# Patient Record
Sex: Female | Born: 1945
Health system: Southern US, Community
[De-identification: ages and names within clinical notes are randomized; demographics above are authoritative.]

## PROBLEM LIST (undated history)

## (undated) DIAGNOSIS — I10 Essential (primary) hypertension: Secondary | ICD-10-CM

## (undated) DIAGNOSIS — C029 Malignant neoplasm of tongue, unspecified: Secondary | ICD-10-CM

## (undated) DIAGNOSIS — G510 Bell's palsy: Secondary | ICD-10-CM

## (undated) HISTORY — PX: JEJUNOSTOMY FEEDING TUBE: SUR737

## (undated) HISTORY — PX: DENTAL SURGERY: SHX609

## (undated) HISTORY — PX: ABDOMINAL HYSTERECTOMY: SHX81

## (undated) HISTORY — DX: Bell's palsy: G51.0

---

## 2000-08-24 ENCOUNTER — Ambulatory Visit (HOSPITAL_COMMUNITY): Admission: RE | Admit: 2000-08-24 | Discharge: 2000-08-24 | Payer: Self-pay | Admitting: Family Medicine

## 2000-08-24 ENCOUNTER — Emergency Department (HOSPITAL_COMMUNITY): Admission: EM | Admit: 2000-08-24 | Discharge: 2000-08-24 | Payer: Self-pay | Admitting: Emergency Medicine

## 2000-08-24 ENCOUNTER — Encounter: Payer: Self-pay | Admitting: Family Medicine

## 2000-08-24 ENCOUNTER — Encounter: Payer: Self-pay | Admitting: Emergency Medicine

## 2003-11-15 ENCOUNTER — Emergency Department (HOSPITAL_COMMUNITY): Admission: EM | Admit: 2003-11-15 | Discharge: 2003-11-15 | Payer: Self-pay | Admitting: Emergency Medicine

## 2004-01-07 ENCOUNTER — Encounter: Admission: RE | Admit: 2004-01-07 | Discharge: 2004-04-06 | Payer: Self-pay | Admitting: Specialist

## 2005-04-26 ENCOUNTER — Inpatient Hospital Stay (HOSPITAL_COMMUNITY): Admission: EM | Admit: 2005-04-26 | Discharge: 2005-05-02 | Payer: Self-pay | Admitting: Emergency Medicine

## 2005-04-28 ENCOUNTER — Ambulatory Visit: Payer: Self-pay | Admitting: Infectious Diseases

## 2005-05-01 ENCOUNTER — Encounter (INDEPENDENT_AMBULATORY_CARE_PROVIDER_SITE_OTHER): Payer: Self-pay | Admitting: *Deleted

## 2005-05-01 ENCOUNTER — Encounter: Payer: Self-pay | Admitting: Interventional Radiology

## 2005-06-02 ENCOUNTER — Ambulatory Visit: Payer: Self-pay | Admitting: Nurse Practitioner

## 2005-06-05 ENCOUNTER — Ambulatory Visit: Payer: Self-pay | Admitting: *Deleted

## 2005-06-12 ENCOUNTER — Ambulatory Visit (HOSPITAL_COMMUNITY): Admission: RE | Admit: 2005-06-12 | Discharge: 2005-06-12 | Payer: Self-pay | Admitting: Family Medicine

## 2005-06-16 ENCOUNTER — Ambulatory Visit: Payer: Self-pay | Admitting: Nurse Practitioner

## 2005-07-05 ENCOUNTER — Ambulatory Visit: Payer: Self-pay | Admitting: Nurse Practitioner

## 2005-09-05 ENCOUNTER — Ambulatory Visit (HOSPITAL_COMMUNITY): Admission: RE | Admit: 2005-09-05 | Discharge: 2005-09-05 | Payer: Self-pay | Admitting: Gastroenterology

## 2005-09-05 ENCOUNTER — Encounter (INDEPENDENT_AMBULATORY_CARE_PROVIDER_SITE_OTHER): Payer: Self-pay | Admitting: Specialist

## 2005-10-17 ENCOUNTER — Ambulatory Visit (HOSPITAL_COMMUNITY): Admission: RE | Admit: 2005-10-17 | Discharge: 2005-10-17 | Payer: Self-pay | Admitting: General Surgery

## 2005-10-27 ENCOUNTER — Ambulatory Visit: Payer: Self-pay | Admitting: Oncology

## 2005-11-01 LAB — CBC WITH DIFFERENTIAL/PLATELET
BASO%: 0.8 % (ref 0.0–2.0)
Basophils Absolute: 0.1 10*3/uL (ref 0.0–0.1)
EOS%: 0.4 % (ref 0.0–7.0)
Eosinophils Absolute: 0 10*3/uL (ref 0.0–0.5)
HGB: 13.4 g/dL (ref 11.6–15.9)
LYMPH%: 45.6 % (ref 14.0–48.0)
MCHC: 34 g/dL (ref 32.0–36.0)
NEUT%: 45.8 % (ref 39.6–76.8)
Platelets: 77 10*3/uL — ABNORMAL LOW (ref 145–400)
WBC: 7 10*3/uL (ref 3.9–10.0)

## 2005-11-02 LAB — COMPREHENSIVE METABOLIC PANEL
ALT: 162 U/L — ABNORMAL HIGH (ref 0–40)
AST: 226 U/L — ABNORMAL HIGH (ref 0–37)
Albumin: 3.5 g/dL (ref 3.5–5.2)
Alkaline Phosphatase: 214 U/L — ABNORMAL HIGH (ref 39–117)
Creatinine, Ser: 0.64 mg/dL (ref 0.40–1.20)
Total Bilirubin: 1.2 mg/dL (ref 0.3–1.2)
Total Protein: 7.6 g/dL (ref 6.0–8.3)

## 2005-11-02 LAB — HEPATITIS B CORE ANTIBODY, TOTAL: Hep B Core Total Ab: NEGATIVE

## 2006-10-30 ENCOUNTER — Ambulatory Visit: Payer: Self-pay | Admitting: Family Medicine

## 2008-06-24 ENCOUNTER — Encounter (INDEPENDENT_AMBULATORY_CARE_PROVIDER_SITE_OTHER): Payer: Self-pay | Admitting: Internal Medicine

## 2008-06-24 ENCOUNTER — Ambulatory Visit: Payer: Self-pay | Admitting: Family Medicine

## 2008-06-24 LAB — CONVERTED CEMR LAB
Basophils Absolute: 0.1 10*3/uL (ref 0.0–0.1)
Basophils Relative: 1 % (ref 0–1)
Eosinophils Relative: 0 % (ref 0–5)
Lymphocytes Relative: 29 % (ref 12–46)
MCV: 100.8 fL — ABNORMAL HIGH (ref 78.0–100.0)
Monocytes Absolute: 0.7 10*3/uL (ref 0.1–1.0)
Neutro Abs: 5.2 10*3/uL (ref 1.7–7.7)
Platelets: 101 10*3/uL — ABNORMAL LOW (ref 150–400)
RBC: 3.9 M/uL (ref 3.87–5.11)
RDW: 17.9 % — ABNORMAL HIGH (ref 11.5–15.5)
WBC: 8.4 10*3/uL (ref 4.0–10.5)

## 2008-07-03 ENCOUNTER — Encounter (INDEPENDENT_AMBULATORY_CARE_PROVIDER_SITE_OTHER): Payer: Self-pay | Admitting: *Deleted

## 2008-07-16 ENCOUNTER — Ambulatory Visit: Payer: Self-pay | Admitting: Internal Medicine

## 2008-07-16 ENCOUNTER — Encounter (INDEPENDENT_AMBULATORY_CARE_PROVIDER_SITE_OTHER): Payer: Self-pay | Admitting: Internal Medicine

## 2008-07-16 LAB — CONVERTED CEMR LAB
ALT: 62 units/L — ABNORMAL HIGH (ref 0–35)
Albumin: 3.8 g/dL (ref 3.5–5.2)
Basophils Relative: 1 % (ref 0–1)
CO2: 20 meq/L (ref 19–32)
Calcium: 8.3 mg/dL — ABNORMAL LOW (ref 8.4–10.5)
Chloride: 108 meq/L (ref 96–112)
Creatinine, Ser: 0.53 mg/dL (ref 0.40–1.20)
Lymphs Abs: 3.2 10*3/uL (ref 0.7–4.0)
MCHC: 30.9 g/dL (ref 30.0–36.0)
Platelets: 116 10*3/uL — ABNORMAL LOW (ref 150–400)
Potassium: 3.8 meq/L (ref 3.5–5.3)
RBC: 4.13 M/uL (ref 3.87–5.11)
RDW: 14.7 % (ref 11.5–15.5)
Sodium: 142 meq/L (ref 135–145)
Total Protein: 8.2 g/dL (ref 6.0–8.3)

## 2008-07-30 ENCOUNTER — Ambulatory Visit (HOSPITAL_COMMUNITY): Admission: RE | Admit: 2008-07-30 | Discharge: 2008-07-30 | Payer: Self-pay | Admitting: Internal Medicine

## 2008-08-13 ENCOUNTER — Ambulatory Visit: Payer: Self-pay | Admitting: Internal Medicine

## 2008-08-27 ENCOUNTER — Ambulatory Visit: Payer: Self-pay | Admitting: Internal Medicine

## 2008-10-15 ENCOUNTER — Ambulatory Visit: Payer: Self-pay | Admitting: Family Medicine

## 2009-05-13 ENCOUNTER — Emergency Department (HOSPITAL_COMMUNITY): Admission: EM | Admit: 2009-05-13 | Discharge: 2009-05-13 | Payer: Self-pay | Admitting: Emergency Medicine

## 2009-06-02 ENCOUNTER — Encounter: Admission: RE | Admit: 2009-06-02 | Discharge: 2009-06-02 | Payer: Self-pay | Admitting: Specialist

## 2009-08-19 ENCOUNTER — Ambulatory Visit: Payer: Self-pay | Admitting: Gastroenterology

## 2009-09-16 ENCOUNTER — Ambulatory Visit: Payer: Self-pay | Admitting: Gastroenterology

## 2009-10-21 ENCOUNTER — Ambulatory Visit: Payer: Self-pay | Admitting: Gastroenterology

## 2010-03-24 ENCOUNTER — Ambulatory Visit: Payer: Self-pay | Admitting: Gastroenterology

## 2010-03-31 ENCOUNTER — Ambulatory Visit: Payer: Self-pay | Admitting: Gastroenterology

## 2010-09-02 NOTE — H&P (Signed)
Elizabeth Lester, Elizabeth Lester               ACCOUNT NO.:  [id number]   MEDICAL RECORD NO.:  [id number]          PATIENT TYPE:  EMS   LOCATION:  ED                           FACILITY:  [facility]   PHYSICIAN:  Sherin Quarry, MD      DATE OF BIRTH:  [birth year]   DATE OF ADMISSION:  04/26/2005  DATE OF DISCHARGE:                                HISTORY & PHYSICAL   HISTORY OF PRESENT ILLNESS:  Elizabeth Lester is a 65 year old lady who  apparently lives with her mother. She is accompanied today by her children  who state that they seldom see her and really do not know very much about  her health history or status. According to the daughters, the patient's  sister had gone out to the store to get some items for their mother.  She  returned to the house about 12:00 p.m. and found Mrs. Coote lying on the  floor of the living room, apparently unconscious.  She had urinated and her  pants were wet.  They called 9-1-1, and apparently before the ambulance  arrived Mrs. Camilli was revived.  On reviving, Mrs. Hashimi indicated that  she had no headache, shortness of breath, chest pain, nausea, vomiting or  abdominal pain. The family feels that Mrs. Hoeg has had no previous  syncopal episodes or previous history of stroke or cardiac disease. Mrs.  Sheffler smokes one-half pack of cigarettes per day. She says that she will  drink any beer, wine or liquor that she can get a hold of, and that probably  she ran out of all alcohol about a day and half ago. Since then, she has  been feeling somewhat tremulous.  Neither of her daughters are willing to  estimate how much alcohol she normally consumes.  She states that she does  not use any drugs.  On further questioning, she indicates that recently she  has noticed some red blood in the stool and has also had intermittent  stabbing pains which she localizes to the periumbilical area.   On arrival to the emergency room, her blood pressure was 165/98, pulse was  115.  There were numerous abnormalities found on her laboratory studies  including a hemoglobin of 10, MCV of 102.8, platelet count of 60,000,  potassium of 3, glucose of 140, albumin of 2.5, SGOT of 204, SGPT of 108,  alkaline phosphatase of 160.  Total bilirubin was 1.2. Urine drug screen was  negative.   In light of the syncopal episode and also the patient's numerous metabolic  abnormalities, as well as her apparent incipient alcohol withdrawal  symptoms, we will admit her to the hospital for further evaluation.   PAST MEDICAL HISTORY:   MEDICATIONS:  None.   ALLERGIES:  None.   OPERATIONS:  The only operation she has had the past was tubal ligation.  According to the patient and also to her daughters, she has never been in  the hospital.   FAMILY HISTORY:  She apparently has 15 siblings.  One of her brothers died  of the brain tumor.  No one is aware of any  of her other siblings having any  health problems. Her mother has hypertension.   SOCIAL HISTORY:  She smokes about one-half to one pack of cigarettes per  day. She drinks a substantial amount of alcohol; the exact amount is  unclear. I gather that she does not get out very much and has fairly limited  exercise tolerance   REVIEW OF SYSTEMS:  HEAD:  She denies headache or dizziness. EYES:  She  denies visual blurring or clear.  EARS/NOSE/THROAT:  Denies earache, sinus  pain or sore throat. CHEST:  Denies coughing wheezing or chest congestion.  CARDIOVASCULAR:  Denies orthopnea, PND or ankle edema. GI: See above. GU:  Denies dysuria or urinary frequency. NEUROLOGIC:  There is no history of  seizure or stroke.  ENDOCRINE:  Denies excessive thirst, urinary frequency  or nocturia.   PHYSICAL EXAMINATION:  GENERAL:  She is a chronically ill-appearing lady who  is edentulous and really has a difficult time giving much useful  information.  HEENT: Exam is within normal limits. There is no meningismus.  CHEST:  Clear.  BACK:   Examination the back reveals no CVA or point tenderness.  SKIN:  Her skin is quite dry.  CARDIOVASCULAR:  Reveals normal S1-S2. There are no rubs, murmurs or  gallops.  ABDOMEN:  Benign. There are normal bowel sounds without masses or  tenderness. There is no guarding or rebound.  NEUROLOGIC TESTING:  Within normal limits.  EXTREMITIES:  Very dry skin.  There is no cyanosis or edema.   IMPRESSION:  1.  Chronic alcohol abuse with apparent incipient alcohol withdrawal.  2.  Recent syncopal episode of unclear etiology.  3.  Possible history of gastrointestinal bleeding.  4.  Anemia with megaloblastic indices.  5.  Thrombocytopenia (these may both be secondary to alcohol abuse).  6.  Hypokalemia.  7.  Abnormal liver functions, possibly secondary to alcohol abuse.  8.  History of untreated hypertension.   PLAN:  The patient will be placed on alcohol withdrawal protocol. Will  obtain a CT scan of the head.  I think a CT scan of the abdomen is also  indicated, given her diffuse liver abnormalities and complaints of  epigastric and periumbilical pain.  Will follow her hemoglobin and  hematocrit, heme stools, check ferritin, B12 and folate.  Will replace  potassium and give her intravenous fluids.  Will treat her elevated blood  pressure with a beta blocker and also give empiric Protonix. I think that  lifestyle issues are the key thing with this patient, but hopefully we can  help in some fashion with this, as well.           ______________________________  Sherin Quarry, MD     SY/MEDQ  D:  04/26/2005  T:  04/26/2005  Job:  [id number]   cc:   [facility]

## 2010-09-02 NOTE — Consult Note (Signed)
Elizabeth Lester, KLIMAS               ACCOUNT NO.:  [id number]   MEDICAL RECORD NO.:  [id number]          PATIENT TYPE:  INP   LOCATION:  1430                         FACILITY:  [facility]   PHYSICIAN:  Cristi Loron, M.D.DATE OF BIRTH:  1945/07/19   DATE OF CONSULTATION:  DATE OF DISCHARGE:  04/27/2005                                   CONSULTATION   CHIEF COMPLAINT:  Fell down.   HISTORY OF PRESENT ILLNESS:  The patient is a 65 year old black female, who  was by report found unconscious yesterday afternoon approximately noon.  The  patient was brought to [facility] via private vehicle and was  admitted by Dr. Sherin Quarry .  The patient underwent further workup  including a brain CT, which demonstrated a right frontal hyper density,  which was followed up by a brain MRI with and without contrast, which  demonstrated a somewhat vascular small right frontal lesion.  The plan is to  work this up further with a cerebral arteriogram, and Dr. Ladona Ridgel has kindly  requested a Neurosurgeon consultation.   Presently, the patient is without complaint.  She denies headaches, nausea,  vomiting, seizures, numbness, tingling, weakness, chest pain, abdominal  pain, etcetera.   PAST MEDICAL HISTORY:  Significant for alcoholism and it is possible she was  withdrawing from alcohol when she had this event.  She denies a prior  history of alcohol withdrawal seizures.   Past medical history is positive for hypertension, alcoholism, a  questionable history of GI bleed, anemia, hypokalemia, abnormal liver  function test.  Thrombocytopenia.   PAST SURGICAL HISTORY:  Hysterectomy.   MEDICATIONS PRIOR TO ADMISSION:  None.   ALLERGIES:  NO KNOWN DRUG ALLERGIES.   FAMILY MEDICAL HISTORY:  The patient's parents are both alive in their 65s  and apparently healthy for their age.   SOCIAL HISTORY:  The patient is single.  She has five children.  She lives  [place].  She smokes one half pack  per day of cigarettes x40 years.  I  advised her to quit.  She by report is an alcoholic, and I advised her to  seek help with quitting this as well.  She denies illicit drug use,  intravenous drug abuse, etcetera.   REVIEW OF SYSTEMS:  Negative except as above.   PHYSICAL EXAMINATION:  GENERAL:  A pleasant tremulous 65 year old black  female in no apparent distress.  HEENT:  Normocephalic, atraumatic.  Pupils equal, round and reactive to  light.  Extraocular muscles are intact.  Oropharynx benign.  NECK:  Supple without deformities, tracheal deviation.  SPINE:  She has limited cervical range of motion, thorac symmetric.  HEART:  Regular rate and rhythm.  ABDOMEN:  Soft, nontender.  Protuberant.  EXTREMITIES:  No obvious deformities.  NEUROLOGIC EXAMINATION:  The patient is alert and oriented x3.  Glasgow Coma  Scale 15.  Cranial nerves 2 through 12 were examined bilaterally and grossly  normal.  The patient hearing grossly normal bilaterally.  Motor strength is  5/5 in her bilateral hand grip, biceps and triceps, deltoid, the psoas,  quadriceps, gastrocnemius, extensor  brevis longus.  Sensory exam is intact  to light.  Sensation altered.  Tested dermatomes bilaterally.  Deep tendon  reflexes are 1/4 in her bilateral biceps, triceps, quadriceps, and  gastrocnemius.  There is no ankle clonus.  Sensory reflexes intact in light  touch and all tests of dermatomes bilateral.  Cerebellar function is intact  to rapid auditory movement in the upper extremities bilateral.   IMAGING STUDIES:  I reviewed the patient's brain/head CT performed April 26, 2005 at [facility].  Without contrast, it demonstrates a small  right frontal hyper density without significant mass effect.   I also reviewed the patient's brain MRI performed with and without contrast  at [facility] April 27, 2005 that demonstrates again a small  right frontal vascular lesion without mass effect.    ASSESSMENT AND PLAN:  Right frontal lesion.  I have discussed the issues  with the patient, her daughter, and son-in-law at the patient's request .  I  told there was several possibilities including a vascular tumor, a vascular  anomaly such as a venous angioma, cavernous angioma, arteriovenous  malformation, myochotic aneurysm, etcetera.  I agree with the transfer to  [facility] and further workup with a cerebral arteriogram to help  [place] distinguish between these lesions.  Depending on what it is, she may or  may not need surgery.   Alcohol abuse, obviously she needs to be treated for DTs medically.      Cristi Loron, M.D.  Electronically Signed     JDJ/MEDQ  D:  04/27/2005  T:  04/28/2005  Job:  [id number]

## 2010-09-02 NOTE — Op Note (Signed)
NAMESERAI, Elizabeth Lester               ACCOUNT NO.:  [id number]   MEDICAL RECORD NO.:  [id number]          PATIENT TYPE:  AMB   LOCATION:  ENDO                         FACILITY:  [facility]   PHYSICIAN:  Petra Kuba, M.D.    DATE OF BIRTH:  10/14/1945   DATE OF PROCEDURE:  09/05/2005  DATE OF DISCHARGE:                                 OPERATIVE REPORT   PROCEDURE:  Colonoscopy with biopsy.   INDICATIONS:  Patient with rectal prolapse, bright red blood per rectum, due  for colonic screening.  Wanted preop evaluation.  Consent was signed after  risks, benefits and options thoroughly discussed with me prior to sedation  and in the office with my nurse.   MEDICINES USED:  Fentanyl 80 mcg, Versed 8 mg.   PROCEDURE:  Rectal inspection was pertinent for obvious prolapse.  Digital  exam is negative.  Pediatric video adjustable colonoscope was inserted and  easily advanced around the colon to the cecum.  Other than a small proximal  ascending lipoma, no abnormalities or signs of bleeding were seen on  insertion.  Cecum was identified by the appendiceal orifice and the  ileocecal valve.  The scope inserted a short way into the terminal ileum  which was normal.  Photo documentation was obtained.  The scope was slowly  withdrawn.  Prep was adequate.  There was some liquid stool that required  suctioning only on slow withdrawal through the colon.  Other than the  lipoma, the cecum, ascending, and transverse were normal.  In the splenic  flexure, a tiny polyp was seen and was cold biopsied x 2.  On slow  withdrawal through the left side of the colon, a few other tiny hyperplastic-  appearing polyps were seen and were cold biopsied times one or two and put  in the same container.  No other abnormalities were seen as we slowly  withdrew back to the rectum.  Anal rectal evaluation on retroflexion  confirmed both the hemorrhoids and probably some small hemorrhoids.  The  scope was then readvanced a short  way up the left side of the colon.  Air  was suctioned.  Scope removed.  The patient tolerated the procedure well.  There was no obvious immediate complication.   ENDOSCOPIC DIAGNOSIS:  1.  Obvious prolapse and small hemorrhoids.  2.  Multiple tiny left-sided polyps, probably hyperplastic except for maybe      the one in the splenic flexure.  Status post cold biopsy.  3.  Ascending proximal lipoma, small, not biopsied.  4.  Otherwise within normal limits to the terminal ileum.   PLAN:  Await pathology to determine future colonic screening.  Okay from my  standpoint for surgical options.  Happy to see back p.r.n.           ______________________________  Petra Kuba, M.D.     MEM/MEDQ  D:  09/05/2005  T:  09/05/2005  Job:  [id number]

## 2010-09-02 NOTE — Discharge Summary (Signed)
NAME:  Elizabeth Lester, Elizabeth Lester               ACCOUNT NO.:  [id number]   MEDICAL RECORD NO.:  [id number]          PATIENT TYPE:  INP   LOCATION:  3016                         FACILITY:  [facility]   PHYSICIAN:  Melissa L. Ladona Ridgel, MD  DATE OF BIRTH:  Feb 06, 1946   DATE OF ADMISSION:  04/28/2005  DATE OF DISCHARGE:  05/02/2005                                 DISCHARGE SUMMARY   CHIEF COMPLAINT:  Syncope.   DISCHARGE DIAGNOSES:  1.  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  She was to follow up as an outpatient for further      evaluation of the treatment options.  The patient had no further      syncopal episodes during the course of the hospital stay.  She was      detoxed from alcohol, with good results.  2.  Alcohol abuse.  The patient underwent detoxification using Ativan      protocol and low-dose Haldol.  At the time of discharge, the patient was      instructed to follow up as an outpatient for further rehabilitation and      supportive therapies.  3.  Hemoccult-positive stool.  The patient has significant hemorrhoids which      appeared to be the source of her bleeding; however, she should obtain an      outpatient esophagogastroduodenoscopy/colonoscopy to assure that there      is no other source of her bleeding from the gastrointestinal tract.  4.  Cardiovascular.  The patient had a syncopal workup which consisted of an      echocardiogram which showed no obvious cardiomyopathy.  Ejection      fraction was within normal limits.  5.  Low-grade temperature.  The patient showed negative blood cultures and      negative urinalysis.  She was followed empirically, and no further      workup was deemed necessary to determine whether or not she had      endocarditis, especially since the angiogram showed an arteriovenous      malformation and not a mycotic aneurysm which was  originally thought to      be the case.   DISCHARGE MEDICATIONS:  1.  Ativan 1 mg p.o. daily.  2.  Multivitamin.   FOLLOW UP:  The patient was requested to follow up at the [facility].   HISTORY OF PRESENT ILLNESS:  The patient is a 65 year old African American  female with a questionable history of GI bleeding in the past who was found  lying in a puddle of her own urine in her living room.  She was brought to  the emergency room for further evaluation where she was noted to have a  negative urine drug screen but was found to have a hemoglobin of 10 with a  macrocytic anemia, a potassium level of 3, elevated liver enzymes, and known  ethanol abuse history.  The patient was admitted to a telemetry floor  and  evaluated by CT scan.  CT of the head showed a abnormality in the frontal  lobe which required further evaluation with MRI.  The patient underwent MRI  which showed an enhancing lesion in the right frontal lobe, questionable for  a mycotic aneurysm.  She was transferred to [facility] and admitted  to the telemetry floor for further evaluation.  A neurosurgery consult was  obtained, and it was planned that she would undergo an angiogram when she  was clinically stable from a detox perspective.  The patient was placed on  an Ativan protocol and detoxified appropriately.  She underwent arteriogram  which showed an AV malformation.  It was decided that the patient would  follow up as an outpatient with Dr. Lovell Sheehan.  This was discussed with the  patient's family who were going to assist her in her followup needs.   On the day of discharge, the patient was seen and evaluated by Dr. Jasmine Pang and found to be stable from a vital sign standpoint.  Cardiovascular  was regular rate and rhythm.  Respiratory was clear to auscultation.  The  abdomen was soft, nontender, nondistended.  Hemoglobin remained stable at  10.  The patient continued to have Hemoccult-positive  stools but no decrease  in her hemoglobin or hematocrit.  The source of the bleed was likely  secondary to hemorrhoidal bleeding which was treated empirically with  softeners and topical agents.  The patient was recommended to have a GI  workup as an outpatient.   The patient was deemed stable for discharge to the care of her daughter who  was to help her followup as an outpatient.   DISPOSITION:  Stable.   FOLLOW UP:  The patient was to follow up with Dr. Lovell Sheehan and [facility].      Melissa L. Ladona Ridgel, MD  Electronically Signed     MLT/MEDQ  D:  06/23/2005  T:  06/26/2005  Job:  [id number]

## 2010-10-26 LAB — HM COLONOSCOPY

## 2010-10-26 LAB — HM MAMMOGRAPHY

## 2011-01-09 DIAGNOSIS — I10 Essential (primary) hypertension: Secondary | ICD-10-CM | POA: Insufficient documentation

## 2012-03-20 DIAGNOSIS — C760 Malignant neoplasm of head, face and neck: Secondary | ICD-10-CM | POA: Insufficient documentation

## 2012-07-22 ENCOUNTER — Encounter (HOSPITAL_COMMUNITY): Payer: Self-pay | Admitting: Emergency Medicine

## 2012-07-22 ENCOUNTER — Emergency Department (HOSPITAL_COMMUNITY)
Admission: EM | Admit: 2012-07-22 | Discharge: 2012-07-22 | Disposition: A | Discharge status: Home / Self Care | Payer: Medicaid Other | Attending: Emergency Medicine | Admitting: Emergency Medicine

## 2012-07-22 ENCOUNTER — Emergency Department (HOSPITAL_COMMUNITY): Payer: Medicaid Other

## 2012-07-22 DIAGNOSIS — H6120 Impacted cerumen, unspecified ear: Secondary | ICD-10-CM | POA: Insufficient documentation

## 2012-07-22 DIAGNOSIS — R05 Cough: Secondary | ICD-10-CM | POA: Insufficient documentation

## 2012-07-22 DIAGNOSIS — F17200 Nicotine dependence, unspecified, uncomplicated: Secondary | ICD-10-CM | POA: Insufficient documentation

## 2012-07-22 DIAGNOSIS — I10 Essential (primary) hypertension: Secondary | ICD-10-CM | POA: Insufficient documentation

## 2012-07-22 DIAGNOSIS — J069 Acute upper respiratory infection, unspecified: Secondary | ICD-10-CM | POA: Insufficient documentation

## 2012-07-22 DIAGNOSIS — Z85810 Personal history of malignant neoplasm of tongue: Secondary | ICD-10-CM | POA: Insufficient documentation

## 2012-07-22 DIAGNOSIS — R059 Cough, unspecified: Secondary | ICD-10-CM | POA: Insufficient documentation

## 2012-07-22 HISTORY — DX: Essential (primary) hypertension: I10

## 2012-07-22 HISTORY — DX: Malignant neoplasm of tongue, unspecified: C02.9

## 2012-07-22 LAB — COMPREHENSIVE METABOLIC PANEL
ALT: 39 U/L — ABNORMAL HIGH (ref 0–35)
AST: 34 U/L (ref 0–37)
Albumin: 3.5 g/dL (ref 3.5–5.2)
Alkaline Phosphatase: 112 U/L (ref 39–117)
BUN: 10 mg/dL (ref 6–23)
CO2: 34 mEq/L — ABNORMAL HIGH (ref 19–32)
Calcium: 9.4 mg/dL (ref 8.4–10.5)
Chloride: 97 mEq/L (ref 96–112)
Creatinine, Ser: 0.57 mg/dL (ref 0.50–1.10)
GFR calc Af Amer: >90 mL/min (ref >90)
GFR calc non Af Amer: >90 mL/min (ref >90)
Glucose, Bld: 102 mg/dL — ABNORMAL HIGH (ref 70–99)
Potassium: 4.2 mEq/L (ref 3.5–5.1)
Sodium: 138 mEq/L (ref 135–145)
Total Bilirubin: 0.3 mg/dL (ref 0.3–1.2)
Total Protein: 7.5 g/dL (ref 6.0–8.3)

## 2012-07-22 LAB — CBC WITH DIFFERENTIAL/PLATELET
Basophils Absolute: 0 10*3/uL (ref 0.0–0.1)
Basophils Relative: 1 % (ref 0–1)
Eosinophils Absolute: 0.1 10*3/uL (ref 0.0–0.7)
Eosinophils Relative: 2 % (ref 0–5)
HCT: 41.2 % (ref 36.0–46.0)
Hemoglobin: 13.6 g/dL (ref 12.0–15.0)
Lymphocytes Relative: 22 % (ref 12–46)
Lymphs Abs: 1.8 10*3/uL (ref 0.7–4.0)
MCH: 32.1 pg (ref 26.0–34.0)
MCHC: 33 g/dL (ref 30.0–36.0)
MCV: 97.2 fL (ref 78.0–100.0)
Monocytes Absolute: 0.8 10*3/uL (ref 0.1–1.0)
Monocytes Relative: 10 % (ref 3–12)
Neutro Abs: 5.5 10*3/uL (ref 1.7–7.7)
Neutrophils Relative %: 66 % (ref 43–77)
Platelets: 172 10*3/uL (ref 150–400)
RBC: 4.24 MIL/uL (ref 3.87–5.11)
RDW: 13.2 % (ref 11.5–15.5)
WBC: 8.2 10*3/uL (ref 4.0–10.5)

## 2012-07-22 MED ORDER — GUAIFENESIN 100 MG/5ML PO LIQD: 100.0000 mg | ORAL | Status: DC | PRN

## 2012-07-22 NOTE — ED Provider Notes (Signed)
History     CSN: [id number]  Arrival date & time 07/22/12  1303   First MD Initiated Contact with Patient 07/22/12 1431      Chief Complaint  Patient presents with  . Dizziness    (Consider location/radiation/quality/duration/timing/severity/associated sxs/prior treatment) HPI Comments: Patient is a 67 year old woman who presents today with dizziness for 1 week. She has had a productive cough for 1 week. She is bringing up "clear phelgm". The cough is worse at night. It has remained unchanged. She is lightheaded without syncope. She states she has been drinking a normal amount of fluid. No loss of appetite, fever, abdominal pain, nausea, vomiting, diarrhea. History of tongue cancer.   The history is provided by the patient. No language interpreter was used.    Past Medical History  Diagnosis Date  . Hypertension   . Cancer of tongue     Past Surgical History  Procedure Laterality Date  . Abdominal hysterectomy      History reviewed. No pertinent family history.  History  Substance Use Topics  . Smoking status: Current Every Day Smoker    Types: Cigarettes  . Smokeless tobacco: Not on file  . Alcohol Use: Yes    OB History   Grav Para Term Preterm Abortions TAB SAB Ect Mult Living                  Review of Systems  Constitutional: Negative for fever, chills and appetite change.  Respiratory: Negative for shortness of breath.   Cardiovascular: Negative for chest pain.  Gastrointestinal: Negative for nausea, vomiting, abdominal pain, diarrhea and abdominal distention.  Genitourinary: Negative for dysuria.  All other systems reviewed and are negative.    Allergies  Review of patient's allergies indicates no known allergies.  Home Medications   Current Outpatient Rx  Name  Route  Sig  Dispense  Refill  . lisinopril (PRINIVIL,ZESTRIL) 20 MG tablet   Oral   Take 20 mg by mouth daily.           BP 155/94  Pulse 77  Temp(Src) 99.4 F (37.4 C) (Oral)   Resp 20  SpO2 98%  Physical Exam  Nursing note and vitals reviewed. Constitutional: She is oriented to person, place, and time. She appears well-developed and well-nourished. No distress.  HENT:  Head: Normocephalic and atraumatic.  Right Ear: Hearing, tympanic membrane and external ear normal.  Left Ear: Hearing, tympanic membrane and external ear normal.  Nose: Nose normal.  Mouth/Throat: Uvula is midline and oropharynx is clear and moist. Mucous membranes are dry. Abnormal dentition.  Cerumen present in both canals Missing dentition  Eyes: Conjunctivae are normal.  Neck: Trachea normal, normal range of motion and phonation normal. No tracheal tenderness present.  Submandibular area hard - likely scar tissue from radiation therapy   Cardiovascular: Normal rate, regular rhythm and normal heart sounds.   Pulmonary/Chest: Effort normal and breath sounds normal. No stridor. No respiratory distress. She has no wheezes. She has no rales.  Abdominal: Soft. She exhibits no distension. There is no tenderness.  Musculoskeletal: Normal range of motion.  Neurological: She is alert and oriented to person, place, and time. She has normal strength.  Skin: Skin is warm and dry. She is not diaphoretic. No erythema.  Psychiatric: She has a normal mood and affect. Her behavior is normal.    ED Course  Procedures (including critical care time)  Labs Reviewed  COMPREHENSIVE METABOLIC PANEL - Abnormal; Notable for the following:  CO2 34 (*)    Glucose, Bld 102 (*)    ALT 39 (*)    All other components within normal limits  CBC WITH DIFFERENTIAL   Dg Chest 2 View  07/22/2012  *RADIOLOGY REPORT*  Clinical Data: Dizziness for several days.  No chest complaints. Hypertension.  Smoker.  CHEST - 2 VIEW  Comparison: 04/26/2005  Findings: Probable gallstones anteriorly on the lateral view.  Midline trachea.  Normal heart size.  Tortuous thoracic aorta. No pleural effusion or pneumothorax.  Diffuse  peribronchial thickening.  Clear lungs.  IMPRESSION: 1.  No acute cardiopulmonary disease. 2.  Mild peribronchial thickening which may relate to chronic bronchitis or smoking. 3.  Cholelithiasis.   Original Report Authenticated By: Jeronimo Greaves, M.D.     Date: 07/22/2012  Rate: 74  Rhythm: normal sinus rhythm  QRS Axis: normal  Intervals: normal  ST/T Wave abnormalities: normal  Conduction Disutrbances:none  Narrative Interpretation:   Old EKG Reviewed: unchanged   1. Upper respiratory virus       MDM  Patient presents for lightheadedness without syncope for 1 week. EKG, orthostatic vital signs WNL. She has had a cough likely due to post nasal drip for 2 weeks. Lungs CTA, oxygen sats stayed 98% on RA through course of ED stay. CXR showed no pneumonia. History of CA on tongue, treated with radiation. Follows with oncologist yearly with negative CTs. Encouraged to keep drinking PO fluids. OTC cold meds. Robitussin given for cough. Resource guide given. She expressed interest in establishing care with new pcp because it is difficult to make appointments with her current PCP. Return precautions given. Vital signs stable for discharge. Dr. Juleen China saw this patient and agrees with plan. Patient / Family / Caregiver informed of clinical course, understand medical decision-making process, and agree with plan.       Mora Bellman, PA-C 07/22/12 1810

## 2012-07-22 NOTE — ED Notes (Signed)
Due to lack of equipment for thorough exam, PA requested pt be moved to room 12 which was done.

## 2012-07-22 NOTE — ED Notes (Signed)
[id number]<AV> Expected date:<BR> Expected time:<BR> Means of arrival:<BR> Comments:<BR> Hold for 27

## 2012-07-22 NOTE — ED Notes (Signed)
PA at bedside.

## 2012-07-22 NOTE — ED Notes (Addendum)
Patient reports dizziness x 1 week.  Patient is taking BP meds.  Patient denies syncope.  Patient also reports having a cold x 2 weeks and rec'd a flu shot last Tuesday.

## 2012-07-27 NOTE — ED Provider Notes (Signed)
Medical screening examination/treatment/procedure(s) were performed by non-physician practitioner and as supervising physician I was immediately available for consultation/collaboration.  Raeford Razor, MD 07/27/12 [phone]

## 2012-10-04 DIAGNOSIS — D696 Thrombocytopenia, unspecified: Secondary | ICD-10-CM | POA: Diagnosis not present

## 2012-10-04 DIAGNOSIS — R5383 Other fatigue: Secondary | ICD-10-CM | POA: Diagnosis not present

## 2012-10-04 DIAGNOSIS — N926 Irregular menstruation, unspecified: Secondary | ICD-10-CM | POA: Diagnosis not present

## 2012-10-04 DIAGNOSIS — R5381 Other malaise: Secondary | ICD-10-CM | POA: Diagnosis not present

## 2012-10-04 DIAGNOSIS — G4700 Insomnia, unspecified: Secondary | ICD-10-CM | POA: Diagnosis not present

## 2012-10-04 DIAGNOSIS — I10 Essential (primary) hypertension: Secondary | ICD-10-CM | POA: Diagnosis not present

## 2012-10-04 DIAGNOSIS — Z01419 Encounter for gynecological examination (general) (routine) without abnormal findings: Secondary | ICD-10-CM | POA: Diagnosis not present

## 2013-02-17 DIAGNOSIS — E7800 Pure hypercholesterolemia, unspecified: Secondary | ICD-10-CM | POA: Diagnosis not present

## 2013-02-17 DIAGNOSIS — R05 Cough: Secondary | ICD-10-CM | POA: Diagnosis not present

## 2013-02-17 DIAGNOSIS — J019 Acute sinusitis, unspecified: Secondary | ICD-10-CM | POA: Diagnosis not present

## 2013-02-17 DIAGNOSIS — R059 Cough, unspecified: Secondary | ICD-10-CM | POA: Diagnosis not present

## 2013-02-17 DIAGNOSIS — H60399 Other infective otitis externa, unspecified ear: Secondary | ICD-10-CM | POA: Diagnosis not present

## 2013-02-17 DIAGNOSIS — H6120 Impacted cerumen, unspecified ear: Secondary | ICD-10-CM | POA: Diagnosis not present

## 2013-02-17 DIAGNOSIS — I10 Essential (primary) hypertension: Secondary | ICD-10-CM | POA: Diagnosis not present

## 2013-03-21 DIAGNOSIS — H60399 Other infective otitis externa, unspecified ear: Secondary | ICD-10-CM | POA: Diagnosis not present

## 2013-03-21 DIAGNOSIS — I10 Essential (primary) hypertension: Secondary | ICD-10-CM | POA: Diagnosis not present

## 2013-03-21 DIAGNOSIS — E7800 Pure hypercholesterolemia, unspecified: Secondary | ICD-10-CM | POA: Diagnosis not present

## 2013-03-21 DIAGNOSIS — R05 Cough: Secondary | ICD-10-CM | POA: Diagnosis not present

## 2013-03-21 DIAGNOSIS — R059 Cough, unspecified: Secondary | ICD-10-CM | POA: Diagnosis not present

## 2013-03-21 DIAGNOSIS — J019 Acute sinusitis, unspecified: Secondary | ICD-10-CM | POA: Diagnosis not present

## 2013-03-24 DIAGNOSIS — Z1231 Encounter for screening mammogram for malignant neoplasm of breast: Secondary | ICD-10-CM | POA: Diagnosis not present

## 2013-03-24 LAB — HM MAMMOGRAPHY

## 2013-03-26 ENCOUNTER — Encounter (HOSPITAL_COMMUNITY): Payer: Self-pay | Admitting: Emergency Medicine

## 2013-03-26 ENCOUNTER — Emergency Department (HOSPITAL_COMMUNITY)
Admission: EM | Admit: 2013-03-26 | Discharge: 2013-03-26 | Disposition: A | Discharge status: Home / Self Care | Payer: Medicare Other | Attending: Emergency Medicine | Admitting: Emergency Medicine

## 2013-03-26 DIAGNOSIS — R42 Dizziness and giddiness: Secondary | ICD-10-CM | POA: Diagnosis present

## 2013-03-26 DIAGNOSIS — H9209 Otalgia, unspecified ear: Secondary | ICD-10-CM | POA: Insufficient documentation

## 2013-03-26 DIAGNOSIS — F17200 Nicotine dependence, unspecified, uncomplicated: Secondary | ICD-10-CM | POA: Insufficient documentation

## 2013-03-26 DIAGNOSIS — Z85810 Personal history of malignant neoplasm of tongue: Secondary | ICD-10-CM | POA: Insufficient documentation

## 2013-03-26 DIAGNOSIS — Z79899 Other long term (current) drug therapy: Secondary | ICD-10-CM | POA: Insufficient documentation

## 2013-03-26 DIAGNOSIS — I10 Essential (primary) hypertension: Secondary | ICD-10-CM | POA: Insufficient documentation

## 2013-03-26 DIAGNOSIS — Z760 Encounter for issue of repeat prescription: Secondary | ICD-10-CM

## 2013-03-26 LAB — CBC
HCT: 38.6 % (ref 36.0–46.0)
Hemoglobin: 12.2 g/dL (ref 12.0–15.0)
MCHC: 31.6 g/dL (ref 30.0–36.0)
WBC: 5.9 10*3/uL (ref 4.0–10.5)

## 2013-03-26 LAB — BASIC METABOLIC PANEL
BUN: 14 mg/dL (ref 6–23)
Chloride: 99 mEq/L (ref 96–112)
Glucose, Bld: 101 mg/dL — ABNORMAL HIGH (ref 70–99)
Potassium: 4.6 mEq/L (ref 3.5–5.1)

## 2013-03-26 MED ORDER — LISINOPRIL 20 MG PO TABS: 20.0000 mg | ORAL_TABLET | Freq: Every day | ORAL | Status: DC

## 2013-03-26 NOTE — ED Notes (Signed)
Bed: WA04 Expected date:  Expected time:  Means of arrival:  Comments: 

## 2013-03-26 NOTE — ED Provider Notes (Signed)
CSN: [id number]     Arrival date & time 03/26/13  4098 History   First MD Initiated Contact with Patient 03/26/13 1116     Chief Complaint  Patient presents with  . Hypertension  . Otalgia  . Dizziness   (Consider location/radiation/quality/duration/timing/severity/associated sxs/prior Treatment) Patient is a 67 y.o. female presenting with ear pain and neurologic complaint. The history is provided by the patient.  Otalgia Location:  Left Behind ear:  No abnormality Quality:  Aching Severity:  Mild Onset quality:  Gradual Duration:  3 weeks Timing:  Intermittent Progression:  Partially resolved Chronicity:  New Associated symptoms: no abdominal pain, no congestion, no cough, no diarrhea, no fever, no headaches, no neck pain and no vomiting   Neurologic Problem This is a new problem. Episode onset: 4 days ago. The problem occurs rarely. The problem has not changed since onset.Pertinent negatives include no chest pain, no abdominal pain, no headaches and no shortness of breath. Nothing aggravates the symptoms. Nothing relieves the symptoms. She has tried nothing for the symptoms. The treatment provided no relief.    Past Medical History  Diagnosis Date  . Hypertension   . Cancer of tongue    Past Surgical History  Procedure Laterality Date  . Abdominal hysterectomy     History reviewed. No pertinent family history. History  Substance Use Topics  . Smoking status: Current Every Day Smoker -- 0.25 packs/day    Types: Cigarettes  . Smokeless tobacco: Never Used  . Alcohol Use: Yes     Comment: occ   OB History   Grav Para Term Preterm Abortions TAB SAB Ect Mult Living                 Review of Systems  Constitutional: Negative for fever and fatigue.  HENT: Positive for ear pain. Negative for congestion and drooling.   Eyes: Negative for pain.  Respiratory: Negative for cough and shortness of breath.   Cardiovascular: Negative for chest pain.  Gastrointestinal:  Negative for nausea, vomiting, abdominal pain and diarrhea.  Genitourinary: Negative for dysuria and hematuria.  Musculoskeletal: Negative for back pain, gait problem and neck pain.  Skin: Negative for color change.  Neurological: Positive for dizziness (mild, intermittent). Negative for headaches.  Hematological: Negative for adenopathy.  Psychiatric/Behavioral: Negative for behavioral problems.  All other systems reviewed and are negative.    Allergies  Review of patient's allergies indicates no known allergies.  Home Medications   Current Outpatient Rx  Name  Route  Sig  Dispense  Refill  . lisinopril (PRINIVIL,ZESTRIL) 20 MG tablet   Oral   Take 20 mg by mouth daily.         . Multiple Vitamin (MULTIVITAMIN WITH MINERALS) TABS tablet   Oral   Take 1 tablet by mouth daily.         . NEOMYCIN-POLYMYXIN-HYDROCORTISONE (CORTISPORIN) 1 % SOLN otic solution   Left Ear   Place 4 drops into the left ear 4 (four) times daily. For 7 days. Patient's last day was Sat. 03/22/13         . lisinopril (PRINIVIL,ZESTRIL) 20 MG tablet   Oral   Take 1 tablet (20 mg total) by mouth daily.   30 tablet   0    BP 155/91  Pulse 93  Temp(Src) 98.4 F (36.9 C) (Oral)  Resp 20  SpO2 100% Physical Exam  Nursing note and vitals reviewed. Constitutional: She is oriented to person, place, and time. She appears well-developed and well-nourished.  HENT:  Head: Normocephalic.  Mouth/Throat: Oropharynx is clear and moist. No oropharyngeal exudate.  Eyes: Conjunctivae and EOM are normal. Pupils are equal, round, and reactive to light.  Neck: Normal range of motion. Neck supple.  Cardiovascular: Normal rate, regular rhythm, normal heart sounds and intact distal pulses.  Exam reveals no gallop and no friction rub.   No murmur heard. Pulmonary/Chest: Effort normal and breath sounds normal. No respiratory distress. She has no wheezes.  Abdominal: Soft. Bowel sounds are normal. There is no  tenderness. There is no rebound and no guarding.  Musculoskeletal: Normal range of motion. She exhibits no edema and no tenderness.  Neurological: She is alert and oriented to person, place, and time. She has normal strength. No cranial nerve deficit or sensory deficit. She displays a negative Romberg sign. Coordination and gait normal.  Normal finger to nose bilaterally.  The patient is able to walk forwards and backwards without any difficulty. She can also walk heel to toe without any difficulty. Romberg negative.  Skin: Skin is warm and dry.  Psychiatric: She has a normal mood and affect. Her behavior is normal.    ED Course  Procedures (including critical care time) Labs Review Labs Reviewed  CBC - Abnormal; Notable for the following:    Platelets 128 (*)    All other components within normal limits  BASIC METABOLIC PANEL - Abnormal; Notable for the following:    Glucose, Bld 101 (*)    All other components within normal limits  POCT I-STAT TROPONIN I   Imaging Review No results found.  EKG Interpretation   None       MDM   1. Medication refill   2. Dizziness    11:45 AM 67 y.o. female with a history of hypertension who presents with intermittent dizziness for 4 days, left intermittent otalgia for several weeks, and request for medication refill. She states that she has been out of her lisinopril for 4 days. She notes intermittent dizziness sensation occasionally when standing since then. She states that she has been well otherwise and denies any fevers, vomiting, diarrhea. She is afebrile and vital signs are unremarkable here. She has a normal neurologic exam. She states that she gets mild intermittent headaches but does not currently have a headache. Will refill her blood pressure medicine and recommend she followup with her primary care provider. I do not believe the testing is necessary at this time.  11:49 AM:  I have discussed the diagnosis/risks/treatment options  with the patient and believe the pt to be eligible for discharge home to follow-up with pcp as needed. We also discussed returning to the ED immediately if new or worsening sx occur. We discussed the sx which are most concerning (e.g., worsening dizziness, cp, sob) that necessitate immediate return. Any new prescriptions provided to the patient are listed below.  New Prescriptions   LISINOPRIL (PRINIVIL,ZESTRIL) 20 MG TABLET    Take 1 tablet (20 mg total) by mouth daily.       Junius Argyle, MD 03/26/13 1150

## 2013-03-26 NOTE — ED Notes (Signed)
Pt c/o hypertension and dizziness x 1 week and L ear pain x "several days."  Pt sts "I went to my doctor to get my BP medication refilled and they haven't called it in yet.  The office isn't open today.  Last time my ear hurt like this, I had wax build up and they had to clean it out."

## 2013-03-26 NOTE — ED Notes (Signed)
Pt complains she has ran out of bp meds since Saturday.  Pt complains of dizziness and left ear pain.

## 2013-06-25 ENCOUNTER — Telehealth: Payer: Self-pay

## 2013-06-25 NOTE — Telephone Encounter (Signed)
Medication and allergies:  Reviewed and updated  90 day supply/mail order: n/a Local pharmacy:  [id number] - [place], [address]   Immunizations due: Influenza-DUE, PNA-DUE, Zoster-DUE   A/P: No changes to personal, family history or past surgical hx PAP- 07/05/05-negative with Trichomonas present CCS- "not recently"- unsure of last one and no record of it in chart MMG- patient believes she received one this year, but could not remember date Flu- DUE Tdap- patient states she's had one within the last 10 years PNA- DUE Shingles- DUE  Patient plans to bring some of her previous records with her during visit.    To Discuss with Provider: Intermittent left ear pain. Denies drainage or hearing loss. Vaginal odor.

## 2013-06-27 ENCOUNTER — Other Ambulatory Visit (HOSPITAL_COMMUNITY)
Admission: RE | Admit: 2013-06-27 | Discharge: 2013-06-27 | Disposition: A | Discharge status: Home / Self Care | Payer: Medicare Other | Source: Ambulatory Visit | Attending: Family Medicine | Admitting: Family Medicine

## 2013-06-27 ENCOUNTER — Ambulatory Visit (INDEPENDENT_AMBULATORY_CARE_PROVIDER_SITE_OTHER): Payer: Medicare Other | Admitting: Family Medicine

## 2013-06-27 ENCOUNTER — Encounter: Payer: Self-pay | Admitting: Family Medicine

## 2013-06-27 VITALS — BP 136/76 | HR 72 | Temp 98.4°F | Ht 66.0 in | Wt 127.0 lb

## 2013-06-27 DIAGNOSIS — N760 Acute vaginitis: Secondary | ICD-10-CM | POA: Diagnosis not present

## 2013-06-27 DIAGNOSIS — Z23 Encounter for immunization: Secondary | ICD-10-CM | POA: Diagnosis not present

## 2013-06-27 DIAGNOSIS — E2839 Other primary ovarian failure: Secondary | ICD-10-CM | POA: Diagnosis not present

## 2013-06-27 DIAGNOSIS — Z124 Encounter for screening for malignant neoplasm of cervix: Secondary | ICD-10-CM

## 2013-06-27 DIAGNOSIS — Z113 Encounter for screening for infections with a predominantly sexual mode of transmission: Secondary | ICD-10-CM | POA: Insufficient documentation

## 2013-06-27 DIAGNOSIS — Z0000 Encounter for general adult medical examination without abnormal findings: Secondary | ICD-10-CM | POA: Diagnosis not present

## 2013-06-27 DIAGNOSIS — I10 Essential (primary) hypertension: Secondary | ICD-10-CM | POA: Diagnosis not present

## 2013-06-27 DIAGNOSIS — N898 Other specified noninflammatory disorders of vagina: Secondary | ICD-10-CM | POA: Diagnosis not present

## 2013-06-27 DIAGNOSIS — Z1151 Encounter for screening for human papillomavirus (HPV): Secondary | ICD-10-CM | POA: Diagnosis not present

## 2013-06-27 LAB — CBC WITH DIFFERENTIAL/PLATELET
BASOS ABS: 0 10*3/uL (ref 0.0–0.1)
BASOS PCT: 0.5 % (ref 0.0–3.0)
EOS ABS: 0.2 10*3/uL (ref 0.0–0.7)
Eosinophils Relative: 1.9 % (ref 0.0–5.0)
HCT: 38.4 % (ref 36.0–46.0)
Hemoglobin: 12.8 g/dL (ref 12.0–15.0)
LYMPHS PCT: 19 % (ref 12.0–46.0)
Lymphs Abs: 1.6 10*3/uL (ref 0.7–4.0)
MCHC: 33.2 g/dL (ref 30.0–36.0)
MCV: 99.2 fl (ref 78.0–100.0)
MONO ABS: 0.7 10*3/uL (ref 0.1–1.0)
Monocytes Relative: 8 % (ref 3.0–12.0)
NEUTROS PCT: 70.6 % (ref 43.0–77.0)
Neutro Abs: 5.9 10*3/uL (ref 1.4–7.7)
PLATELETS: 131 10*3/uL — AB (ref 150.0–400.0)
RBC: 3.88 Mil/uL (ref 3.87–5.11)
RDW: 14.8 % — AB (ref 11.5–14.6)
WBC: 8.4 10*3/uL (ref 4.5–10.5)

## 2013-06-27 LAB — LIPID PANEL
CHOL/HDL RATIO: 2
Cholesterol: 169 mg/dL (ref 0–200)
HDL: 70.8 mg/dL (ref >39.00)
LDL Cholesterol: 78 mg/dL (ref 0–99)
TRIGLYCERIDES: 102 mg/dL (ref 0.0–149.0)
VLDL: 20.4 mg/dL (ref 0.0–40.0)

## 2013-06-27 LAB — HEPATIC FUNCTION PANEL
ALBUMIN: 4.3 g/dL (ref 3.5–5.2)
ALK PHOS: 66 U/L (ref 39–117)
ALT: 53 U/L — ABNORMAL HIGH (ref 0–35)
AST: 39 U/L — ABNORMAL HIGH (ref 0–37)
BILIRUBIN DIRECT: 0 mg/dL (ref 0.0–0.3)
TOTAL PROTEIN: 7.4 g/dL (ref 6.0–8.3)
Total Bilirubin: 0.6 mg/dL (ref 0.3–1.2)

## 2013-06-27 LAB — POCT URINALYSIS DIPSTICK
BILIRUBIN UA: NEGATIVE
Glucose, UA: NEGATIVE
KETONES UA: NEGATIVE
Leukocytes, UA: NEGATIVE
Nitrite, UA: NEGATIVE
PH UA: 6
PROTEIN UA: NEGATIVE
RBC UA: NEGATIVE
Spec Grav, UA: 1.01
Urobilinogen, UA: 0.2

## 2013-06-27 LAB — BASIC METABOLIC PANEL
BUN: 15 mg/dL (ref 6–23)
CALCIUM: 9.6 mg/dL (ref 8.4–10.5)
CHLORIDE: 103 meq/L (ref 96–112)
CO2: 29 meq/L (ref 19–32)
CREATININE: 0.5 mg/dL (ref 0.4–1.2)
GFR: 148.5 mL/min (ref >60.00)
GLUCOSE: 98 mg/dL (ref 70–99)
Potassium: 3.9 mEq/L (ref 3.5–5.1)
Sodium: 139 mEq/L (ref 135–145)

## 2013-06-27 NOTE — Progress Notes (Signed)
Subjective:    Elizabeth Lester is a 68 y.o. female who presents for a welcome to Medicare exam.   Cardiac risk factors: advanced age (older than 73 for men, 72 for women), hypertension and sedentary lifestyle.  Activities of Daily Living  In your present state of health, do you have any difficulty performing the following activities?:  Preparing food and eating?: No Bathing yourself: No Getting dressed: No Using the toilet:No Moving around from place to place: No In the past year have you fallen or had a near fall?:No  Current exercise habits: The patient does not participate in regular exercise at present.   Dietary issues discussed: na   Depression Screen (Note: if answer to either of the following is "Yes", then a more complete depression screening is indicated)  Q1: Over the past two weeks, have you felt down, depressed or hopeless?no Q2: Over the past two weeks, have you felt little interest or pleasure in doing things? no   The following portions of the patient's history were reviewed and updated as appropriate: allergies, current medications, past family history, past medical history, past social history, past surgical history and problem list. Review of Systems  Review of Systems  Constitutional: Negative for activity change, appetite change and fatigue.  HENT: Negative for hearing loss, congestion, tinnitus and ear discharge.   Eyes: Negative for visual disturbance (see optho q1y -- vision corrected to 20/20 with glasses).  Respiratory: Negative for cough, chest tightness and shortness of breath.   Cardiovascular: Negative for chest pain, palpitations and leg swelling.  Gastrointestinal: Negative for abdominal pain, diarrhea, constipation and abdominal distention.  Genitourinary: Negative for urgency, frequency, decreased urine volume and difficulty urinating.  Musculoskeletal: Negative for back pain, arthralgias and gait problem.  Skin: Negative for color change, pallor  and rash.  Neurological: Negative for dizziness, light-headedness, numbness and headaches.  Hematological: Negative for adenopathy. Does not bruise/bleed easily.  Psychiatric/Behavioral: Negative for suicidal ideas, confusion, sleep disturbance, self-injury, dysphoric mood, decreased concentration and agitation.  Pt is able to read and write and can do all ADLs No risk for falling No abuse/ violence in home      Objective:     Vision by Snellen chart: opth Blood pressure 136/76, pulse 72, temperature 98.4 F (36.9 C), temperature source Oral, height 5\' 6"  (1.676 m), weight 127 lb (57.607 kg), SpO2 99.00%. Body mass index is 20.51 kg/(m^2). BP 136/76  Pulse 72  Temp(Src) 98.4 F (36.9 C) (Oral)  Ht 5\' 6"  (1.676 m)  Wt 127 lb (57.607 kg)  BMI 20.51 kg/m2  SpO2 99% General appearance: alert, cooperative, appears stated age and no distress Head: Normocephalic, without obvious abnormality, atraumatic Eyes: conjunctivae/corneas clear. PERRL, EOM's intact. Fundi benign. Ears: normal TM's and external ear canals both ears Nose: Nares normal. Septum midline. Mucosa normal. No drainage or sinus tenderness. Throat: abnormal findings: s/p hx ca tongue Neck: no adenopathy, supple, symmetrical, trachea midline and thyroid not enlarged, symmetric, no tenderness/mass/nodules Back: symmetric, no curvature. ROM normal. No CVA tenderness. Lungs: clear to auscultation bilaterally Breasts: normal appearance, no masses or tenderness Heart: regular rate and rhythm, S1, S2 normal, no murmur, click, rub or gallop Abdomen: soft, non-tender; bowel sounds normal; no masses,  no organomegaly Pelvic: normal ext genitalia, + d/c, pap and cultures done Extremities: extremities normal, atraumatic, no cyanosis or edema Pulses: 2+ and symmetric Skin: Skin color, texture, turgor normal. No rashes or lesions Lymph nodes: Cervical, supraclavicular, and axillary nodes normal. Neurologic: Alert and oriented X 3,  normal strength and tone. Normal symmetric reflexes. Normal coordination and gait Psych- no depression, no anxiety      Assessment:     cpe      Plan:     During the course of the visit the patient was educated and counseled about appropriate screening and preventive services including:   Pneumococcal vaccine   Influenza vaccine  Screening electrocardiogram  Screening mammography  Bone densitometry screening  Colorectal cancer screening  Diabetes screening  Glaucoma screening  Advanced directives: has an advanced directive - a copy HAS NOT been provided.  Patient Instructions (the written plan) was given to the patient.   1. Welcome to Medicare preventive visit  Check labs See avs - EKG 12-Lead  2. Need for prophylactic vaccination and inoculation against influenza  - Flu Vaccine QUAD 36+ mos PF IM (Fluarix)  3. Need for pneumococcal vaccination  - Pneumococcal polysaccharide vaccine 23-valent greater than or equal to 2yo subcutaneous/IM  4. Estrogen deficiency  - DG Bone Density; Future  5. HTN (hypertension) Stable, con't meds - Basic metabolic panel - CBC with Differential - Hepatic function panel - Lipid panel - POCT urinalysis dipstick  6. Screening for malignant neoplasm of the cervix  - Cytology - PAP  7. Vaginal discharge Check pap and cultures - Cytology - PAP

## 2013-06-27 NOTE — Progress Notes (Signed)
Pre visit review using our clinic review tool, if applicable. No additional management support is needed unless otherwise documented below in the visit note. 

## 2013-06-27 NOTE — Patient Instructions (Signed)

## 2013-06-30 ENCOUNTER — Other Ambulatory Visit: Payer: Self-pay

## 2013-06-30 ENCOUNTER — Telehealth: Payer: Self-pay | Admitting: Family Medicine

## 2013-06-30 DIAGNOSIS — R740 Nonspecific elevation of levels of transaminase and lactic acid dehydrogenase [LDH]: Principal | ICD-10-CM

## 2013-06-30 DIAGNOSIS — R7402 Elevation of levels of lactic acid dehydrogenase (LDH): Secondary | ICD-10-CM

## 2013-06-30 DIAGNOSIS — D7289 Other specified disorders of white blood cells: Secondary | ICD-10-CM

## 2013-06-30 NOTE — Telephone Encounter (Signed)
Relevant patient education mailed to patient.  

## 2013-06-30 NOTE — Telephone Encounter (Signed)
Patient called and stating that she was seen by dr Etter Sjogren on Friday and she was suppose to have some ear drop medication called into her pharmacy. Please advise.

## 2013-06-30 NOTE — Telephone Encounter (Signed)
Patient has been made aware and agreed to recommendations       KP

## 2013-06-30 NOTE — Telephone Encounter (Signed)
No Rx--- just use debrox otc for wax and call for irrigation if no improvement in 1 week

## 2013-06-30 NOTE — Telephone Encounter (Signed)
No documentation of drops. Please advise      KP

## 2013-07-01 LAB — CERVICOVAGINAL ANCILLARY ONLY
Bacterial vaginitis: NEGATIVE
Candida vaginitis: NEGATIVE

## 2013-07-07 DIAGNOSIS — Z780 Asymptomatic menopausal state: Secondary | ICD-10-CM | POA: Diagnosis not present

## 2013-07-07 LAB — HM DEXA SCAN

## 2013-07-14 ENCOUNTER — Other Ambulatory Visit (INDEPENDENT_AMBULATORY_CARE_PROVIDER_SITE_OTHER): Payer: Medicare Other

## 2013-07-14 DIAGNOSIS — D7289 Other specified disorders of white blood cells: Secondary | ICD-10-CM

## 2013-07-14 DIAGNOSIS — R7401 Elevation of levels of liver transaminase levels: Secondary | ICD-10-CM | POA: Diagnosis not present

## 2013-07-14 DIAGNOSIS — R7402 Elevation of levels of lactic acid dehydrogenase (LDH): Secondary | ICD-10-CM | POA: Diagnosis not present

## 2013-07-14 DIAGNOSIS — R740 Nonspecific elevation of levels of transaminase and lactic acid dehydrogenase [LDH]: Principal | ICD-10-CM

## 2013-07-14 LAB — GAMMA GT: GGT: 56 U/L — ABNORMAL HIGH (ref 7–51)

## 2013-07-14 LAB — HEPATIC FUNCTION PANEL
ALT: 40 U/L — AB (ref 0–35)
AST: 29 U/L (ref 0–37)
Albumin: 4.3 g/dL (ref 3.5–5.2)
Alkaline Phosphatase: 64 U/L (ref 39–117)
BILIRUBIN DIRECT: 0 mg/dL (ref 0.0–0.3)
BILIRUBIN TOTAL: 0.3 mg/dL (ref 0.3–1.2)
TOTAL PROTEIN: 7.5 g/dL (ref 6.0–8.3)

## 2013-07-15 LAB — CBC WITH DIFFERENTIAL/PLATELET
Basophils Absolute: 0 10*3/uL (ref 0.0–0.1)
Basophils Relative: 0.3 % (ref 0.0–3.0)
Eosinophils Absolute: 0.2 10*3/uL (ref 0.0–0.7)
Eosinophils Relative: 2.6 % (ref 0.0–5.0)
HCT: 39.3 % (ref 36.0–46.0)
Hemoglobin: 13 g/dL (ref 12.0–15.0)
Lymphocytes Relative: 24.7 % (ref 12.0–46.0)
Lymphs Abs: 2 10*3/uL (ref 0.7–4.0)
MCHC: 33 g/dL (ref 30.0–36.0)
MCV: 98.4 fl (ref 78.0–100.0)
Monocytes Absolute: 0.5 10*3/uL (ref 0.1–1.0)
Monocytes Relative: 6.6 % (ref 3.0–12.0)
Neutro Abs: 5.4 10*3/uL (ref 1.4–7.7)
Neutrophils Relative %: 65.8 % (ref 43.0–77.0)
Platelets: 130 10*3/uL — ABNORMAL LOW (ref 150.0–400.0)
RBC: 3.99 Mil/uL (ref 3.87–5.11)
RDW: 14.1 % (ref 11.5–14.6)
WBC: 8.2 10*3/uL (ref 4.5–10.5)

## 2013-09-29 ENCOUNTER — Ambulatory Visit (INDEPENDENT_AMBULATORY_CARE_PROVIDER_SITE_OTHER): Payer: Medicare Other | Admitting: Family Medicine

## 2013-09-29 ENCOUNTER — Encounter: Payer: Self-pay | Admitting: Family Medicine

## 2013-09-29 VITALS — BP 136/82 | HR 71 | Temp 98.8°F | Wt 129.4 lb

## 2013-09-29 DIAGNOSIS — H6120 Impacted cerumen, unspecified ear: Secondary | ICD-10-CM | POA: Diagnosis not present

## 2013-09-29 DIAGNOSIS — R7989 Other specified abnormal findings of blood chemistry: Secondary | ICD-10-CM | POA: Diagnosis not present

## 2013-09-29 DIAGNOSIS — H60399 Other infective otitis externa, unspecified ear: Secondary | ICD-10-CM

## 2013-09-29 DIAGNOSIS — H6092 Unspecified otitis externa, left ear: Secondary | ICD-10-CM

## 2013-09-29 DIAGNOSIS — R945 Abnormal results of liver function studies: Principal | ICD-10-CM

## 2013-09-29 MED ORDER — OFLOXACIN 0.3 % OT SOLN: 10.0000 [drp] | Freq: Every day | OTIC | Status: DC

## 2013-09-29 NOTE — Patient Instructions (Signed)
Liver Panel A liver panel, also known as liver (hepatic) function tests or LFT, is used to detect liver damage or disease. One or more of these tests are ordered when symptoms suspicious of a liver condition are noticed. These include: jaundice, dark urine, or light-colored bowel movements; nausea, vomiting and/or diarrhea; loss of appetite; vomiting of blood; bloody or black bowel movements; swelling or pain in the belly; unusual weight change; or fatigue or loss of stamina. One or more of these tests may also be ordered when a person has been or may have been exposed to a hepatitis virus; has a family history of liver disease; has excessive alcohol intake; or is taking a drug that can cause liver damage. A liver panel usually includes 7 tests that are run at the same time on a blood sample. These include:  Alanine aminotransferase (ALT)  an enzyme mainly found in the liver; the best test for detecting hepatitis.  NORMAL FINDINGS  Elderly: may be slightly higher than adult values  Adult/child: 4-36 international units/L at 37 C or4-36 units/L (SI units)  Values may be higher in men and in African Americans.  Infant: may be twice as high as adult values. Alkaline phosphatase (ALP)  an enzyme related to the bile ducts; often increased when they are blocked NORMAL FINDINGS  Elderly: slightly higher than an adult.  Adult: 30-120 units/L or 0.5-2.0 microKat/L (SI units)  Child:/adolescent:  Less than 2 years: 85-235 units/L  2-8 Years: 65-210 units/L  9-15 years: 60-300 units/L  16-21 years: 30-200 units/L Aspartate aminotransferase (AST)  an enzyme found in the liver and a few other places, particularly the heart and other muscles in the body. NORMAL FINDINGS  Age / Normal value (units)  0-5 days / 35-140  Less than 3 yr / 15-60  3-6 yr / 15-50  6-12 yr / 10-50  12-18 yr / 10-40  Adult / 0-35 units/L or 0-0.58 microKat/L (SI units) (Females tend to have slightly lower than  males)  Elderly / Slightly higher than adults. Bilirubin  two different tests of bilirubin often used together (especially if a person has jaundice): total bilirubin measures all the bilirubin in the blood; direct bilirubin measures a form made in the liver.   Adult/elderly/child  Total bilirubin: 0.3-1.0 mg/dL or 5.1-17 micromole/L (SI units)  Indirect bilirubin: 0.2-0.8 mg/dL or 3.4-12.0 micromole/L (SI units)  Direct bilirubin: 0.1-0.3 mg/dL or 1.7-5.1 micromole/L (SI units)  Newborn total bilirubin:1.0-12.0 mg/dL or17.1-205 micromole/L (SI units)  Urine: 0.0-0.2 mg/dL Albumin  measures the main protein made by the liver and tells how well the liver is making this protein  Total Protein - measures albumin and all other proteins in blood, including antibodies made to help fight off infections.  NORMAL FINDINGS  Adult/Elderly  Total protein: 6.4-8.3 g/dL or 64-83 g/L (SI units)  Albumin: 3.5-5 g/dL or 35-50 g/L (SI units)  Globulin: 2.3-3.4 g/dL  Alpha1 globulin: 0.1-0.3 g/dL or 1-3 g/L (SI units)  Alpha2 globulin: 0.6-1 g/dL or 6-10 g/L (SI units)  Beta globulin: 0.7-1.1 g/dL or 7-11 g/L (SI units) Children  Total protein  Premature infant: 4.2-7.6 g/dL  Newborn: 4.6-7.4 g/dL  Infant: 6-6.7 g/dL  Child: 6.2-8 g/dL  Albumin  Premature infant: 3-4.2 g/dL  Newborn: 3.5-5.4 g/dL  Infant: 4.4-5.4 g/dL  Child: 4-5.9 g/dL Ranges for normal findings may vary among different laboratories and hospitals. You should always check with your doctor after having lab work or other tests done to discuss the meaning of your   test results and whether your values are considered within normal limits. MEANING OF TEST  Your caregiver will go over the test results with you and discuss the importance and meaning of your results, as well as treatment options and the need for additional tests if necessary. OBTAINING THE TEST RESULTS It is your responsibility to obtain your test results.  Ask the lab or department performing the test when and how you will get your results. Document Released: 04/28/2004 Document Revised: 06/26/2011 Document Reviewed: 03/16/2008 [employer] Patient Information 2014 [employer].  

## 2013-09-29 NOTE — Progress Notes (Signed)
  Subjective:    Patient here for follow-up of elevated blood pressure.  She is not exercising and is adherent to a low-salt diet.  Blood pressure is well controlled at home. Cardiac symptoms: none. Patient denies: chest pain, chest pressure/discomfort, claudication, dyspnea, exertional chest pressure/discomfort, fatigue, irregular heart beat, lower extremity edema, near-syncope, orthopnea, palpitations, paroxysmal nocturnal dyspnea, syncope and tachypnea. Cardiovascular risk factors: advanced age (older than 62 for men, 26 for women) and hypertension. Use of agents associated with hypertension: none. History of target organ damage: none. Pt also c/o L ear feeling clogged and odor from ear.  No other complaints.  The following portions of the patient's history were reviewed and updated as appropriate: allergies, current medications, past family history, past medical history, past social history, past surgical history and problem list.  Review of Systems Pertinent items are noted in HPI.     Objective:    BP 136/82  Pulse 71  Temp(Src) 98.8 F (37.1 C) (Oral)  Wt 129 lb 6.4 oz (58.695 kg)  SpO2 97% General appearance: alert, cooperative, appears stated age and no distress Ears: abnormal TM left ear - + wax and pus with swelling in canal Neck: no adenopathy, no carotid bruit, no JVD, supple, symmetrical, trachea midline and thyroid not enlarged, symmetric, no tenderness/mass/nodules Lungs: clear to auscultation bilaterally Heart: S1, S2 normal Extremities: extremities normal, atraumatic, no cyanosis or edema    Assessment:    Hypertension, normal blood pressure . Evidence of target organ damage: none.    Plan:    Medication: no change. Dietary sodium restriction. Check blood pressures 2-3 times weekly and record. Follow up: 2 weeks and as needed. ---to recheck ear  1. Elevated liver function tests   - Hepatic function panel  2. Left otitis externa   - ofloxacin (FLOXIN) 0.3  % otic solution; Place 10 drops into the left ear daily.  Dispense: 5 mL; Refill: 0  3. Cerumen impaction  Recheck 1-2 weeks for irrigation prn - ofloxacin (FLOXIN) 0.3 % otic solution; Place 10 drops into the left ear daily.  Dispense: 5 mL; Refill: 0

## 2013-09-29 NOTE — Progress Notes (Signed)
Pre visit review using our clinic review tool, if applicable. No additional management support is needed unless otherwise documented below in the visit note. 

## 2013-09-30 LAB — HEPATIC FUNCTION PANEL
ALT: 30 U/L (ref 0–35)
AST: 27 U/L (ref 0–37)
Albumin: 4.1 g/dL (ref 3.5–5.2)
Alkaline Phosphatase: 61 U/L (ref 39–117)
BILIRUBIN TOTAL: 0.4 mg/dL (ref 0.2–1.2)
Bilirubin, Direct: 0.1 mg/dL (ref 0.0–0.3)
TOTAL PROTEIN: 7.2 g/dL (ref 6.0–8.3)

## 2014-01-05 ENCOUNTER — Ambulatory Visit (INDEPENDENT_AMBULATORY_CARE_PROVIDER_SITE_OTHER): Payer: Medicare Other | Admitting: Family Medicine

## 2014-01-05 ENCOUNTER — Encounter: Payer: Self-pay | Admitting: Family Medicine

## 2014-01-05 VITALS — BP 147/82 | HR 67 | Temp 98.2°F | Wt 130.1 lb

## 2014-01-05 DIAGNOSIS — H60399 Other infective otitis externa, unspecified ear: Secondary | ICD-10-CM | POA: Diagnosis not present

## 2014-01-05 DIAGNOSIS — H60392 Other infective otitis externa, left ear: Secondary | ICD-10-CM

## 2014-01-05 MED ORDER — OFLOXACIN 0.3 % OT SOLN: 5.0000 [drp] | Freq: Every day | OTIC | Status: DC

## 2014-01-05 MED ORDER — CEFUROXIME AXETIL 500 MG PO TABS: 500.0000 mg | ORAL_TABLET | Freq: Two times a day (BID) | ORAL | Status: AC

## 2014-01-05 NOTE — Progress Notes (Signed)
Pre visit review using our clinic review tool, if applicable. No additional management support is needed unless otherwise documented below in the visit note. 

## 2014-01-05 NOTE — Progress Notes (Signed)
   Subjective:    Patient ID: Elizabeth Lester, female    DOB: April 28, 1945, 68 y.o.   MRN: [id number]  HPI Pt here c/o R ear feeling plugged and painful.   No other symptoms   Review of Systems    as abve Objective:   Physical Exam  BP 147/82  Pulse 67  Temp(Src) 98.2 F (36.8 C) (Oral)  Wt 130 lb 1.1 oz (59 kg)  SpO2 100% Ears:  r ear--+ errythema and pus -- after wax was removed with irrigation.  Unable to use hoop.        Assessment & Plan:  1. Otitis, externa, infective, left Take meds as rx,  rto prn - ofloxacin (FLOXIN) 0.3 % otic solution; Place 5 drops into the left ear daily.  Dispense: 5 mL; Refill: 0 - cefUROXime (CEFTIN) 500 MG tablet; Take 1 tablet (500 mg total) by mouth 2 (two) times daily.  Dispense: 20 tablet; Refill: 0

## 2014-01-05 NOTE — Patient Instructions (Signed)

## 2014-05-05 ENCOUNTER — Ambulatory Visit (INDEPENDENT_AMBULATORY_CARE_PROVIDER_SITE_OTHER): Payer: Medicare Other | Admitting: Family Medicine

## 2014-05-05 ENCOUNTER — Encounter: Payer: Self-pay | Admitting: Family Medicine

## 2014-05-05 VITALS — BP 160/92 | HR 75 | Temp 98.3°F | Wt 129.0 lb

## 2014-05-05 DIAGNOSIS — I10 Essential (primary) hypertension: Secondary | ICD-10-CM

## 2014-05-05 DIAGNOSIS — J0110 Acute frontal sinusitis, unspecified: Secondary | ICD-10-CM

## 2014-05-05 DIAGNOSIS — H9202 Otalgia, left ear: Secondary | ICD-10-CM

## 2014-05-05 MED ORDER — ACETIC ACID 2 % OT SOLN: 4.0000 [drp] | Freq: Four times a day (QID) | OTIC | Status: DC

## 2014-05-05 MED ORDER — CEFUROXIME AXETIL 500 MG PO TABS: 500.0000 mg | ORAL_TABLET | Freq: Two times a day (BID) | ORAL | Status: AC

## 2014-05-05 MED ORDER — FLUTICASONE PROPIONATE 50 MCG/ACT NA SUSP: 2.0000 | Freq: Every day | NASAL | Status: DC

## 2014-05-05 NOTE — Progress Notes (Signed)
  Subjective:     Elizabeth Lester is a 69 y.o. female who presents for evaluation of sinus pain. Symptoms include: congestion, cough, facial pain, foul rhinorrhea, headaches, nasal congestion, purulent rhinorrhea, sinus pressure and con't L ear pain-- daughter is with her and she states the ear has been bothering her for a year or more.. Onset of symptoms was 2 weeks ago. Symptoms have been gradually worsening since that time. Past history is significant for no history of pneumonia or bronchitis. Patient is a smoker.  The following portions of the patient's history were reviewed and updated as appropriate: allergies, current medications, past family history, past medical history, past social history, past surgical history and problem list.  Review of Systems Pertinent items are noted in HPI.   Objective:    BP 160/92 mmHg  Pulse 75  Temp(Src) 98.3 F (36.8 C) (Oral)  Wt 129 lb (58.514 kg)  SpO2 98% General appearance: alert, cooperative, appears stated age and no distress Ears: L canal flaky and red Nose: green discharge, moderate congestion, turbinates red, swollen, sinus tenderness bilateral Throat: abnormal findings: mild oropharyngeal erythema and lymphadenopathy Neck: mild anterior cervical adenopathy, supple, symmetrical, trachea midline and thyroid not enlarged, symmetric, no tenderness/mass/nodules Lungs: clear to auscultation bilaterally    Assessment:    Acute bacterial sinusitis.    Plan:    Nasal steroids per medication orders. Antihistamines per medication orders. Ceftin per medication orders.    1. Otalgia, left Unrelated to #2-- been a problem for over a year - Ambulatory referral to ENT - acetic acid (VOSOL) 2 % otic solution; Place 4 drops into the left ear 4 (four) times daily.  Dispense: 15 mL; Refill: 0  2. Acute frontal sinusitis, recurrence not specified  - cefUROXime (CEFTIN) 500 MG tablet; Take 1 tablet (500 mg total) by mouth 2 (two) times daily.   Dispense: 20 tablet; Refill: 0 - fluticasone (FLONASE) 50 MCG/ACT nasal spray; Place 2 sprays into both nostrils daily.  Dispense: 16 g; Refill: 6

## 2014-05-05 NOTE — Patient Instructions (Signed)

## 2014-05-05 NOTE — Assessment & Plan Note (Signed)
Stop nyquil severe cold and rto 2-3 weeks or sooner prn

## 2014-05-05 NOTE — Progress Notes (Signed)
Pre visit review using our clinic review tool, if applicable. No additional management support is needed unless otherwise documented below in the visit note. 

## 2014-05-21 ENCOUNTER — Ambulatory Visit: Payer: Medicare Other | Admitting: Family Medicine

## 2014-05-26 ENCOUNTER — Ambulatory Visit (INDEPENDENT_AMBULATORY_CARE_PROVIDER_SITE_OTHER): Payer: Medicare Other | Admitting: Family Medicine

## 2014-05-26 ENCOUNTER — Encounter: Payer: Self-pay | Admitting: Family Medicine

## 2014-05-26 VITALS — BP 138/90 | HR 78 | Temp 98.3°F | Wt 128.0 lb

## 2014-05-26 DIAGNOSIS — H6090 Unspecified otitis externa, unspecified ear: Secondary | ICD-10-CM | POA: Diagnosis not present

## 2014-05-26 DIAGNOSIS — J0110 Acute frontal sinusitis, unspecified: Secondary | ICD-10-CM | POA: Diagnosis not present

## 2014-05-26 DIAGNOSIS — Z23 Encounter for immunization: Secondary | ICD-10-CM

## 2014-05-26 NOTE — Progress Notes (Signed)
Pre visit review using our clinic review tool, if applicable. No additional management support is needed unless otherwise documented below in the visit note. 

## 2014-05-26 NOTE — Progress Notes (Signed)
   Subjective:    Patient ID: Elizabeth Lester, female    DOB: November 23, 1945, 69 y.o.   MRN: [id number]  HPI  Patient here for f/u R ear pain and odor.  She states the pain is gone but she still has odor.   She is also c/o r side neck pain when she leans forward and turns head--symptoms < 92months but greater then 2 months. No other symptoms. Pain only last a few seconds.    Past Medical History  Diagnosis Date  . Hypertension   . Cancer of tongue   . Bell's palsy     Review of Systems  Constitutional: Negative for activity change, appetite change, fatigue and unexpected weight change.  Respiratory: Negative for cough and shortness of breath.   Cardiovascular: Negative for chest pain and palpitations.  Musculoskeletal: Positive for neck pain and neck stiffness.  Psychiatric/Behavioral: Negative for behavioral problems and dysphoric mood. The patient is not nervous/anxious.        Objective:    Physical Exam  Constitutional: She is oriented to person, place, and time. She appears well-developed and well-nourished. No distress.  HENT:  Right Ear: External ear normal.  Left Ear: External ear normal.  Nose: Nose normal.  Mouth/Throat: No oropharyngeal exudate.  Eyes: EOM are normal. Pupils are equal, round, and reactive to light.  Neck: Normal range of motion. Neck supple. No JVD present. Carotid bruit is not present.  Cardiovascular: Normal rate, regular rhythm and normal heart sounds.   No murmur heard. Pulmonary/Chest: Effort normal and breath sounds normal. No respiratory distress. She has no wheezes. She has no rales. She exhibits no tenderness.  Musculoskeletal: Normal range of motion. She exhibits no edema or tenderness.  Lymphadenopathy:    She has no cervical adenopathy.  Neurological: She is alert and oriented to person, place, and time.  Psychiatric: She has a normal mood and affect. Her behavior is normal. Judgment and thought content normal.    BP 138/90 mmHg  Pulse 78   Temp(Src) 98.3 F (36.8 C) (Oral)  Wt 128 lb (58.06 kg)  SpO2 98% Wt Readings from Last 3 Encounters:  05/26/14 128 lb (58.06 kg)  05/05/14 129 lb (58.514 kg)  01/05/14 130 lb 1.1 oz (59 kg)     Lab Results  Component Value Date   WBC 8.2 07/14/2013   HGB 13.0 07/14/2013   HCT 39.3 07/14/2013   PLT 130.0* 07/14/2013   GLUCOSE 98 06/27/2013   CHOL 169 06/27/2013   TRIG 102.0 06/27/2013   HDL 70.80 06/27/2013   LDLCALC 78 06/27/2013   ALT 30 09/29/2013   AST 27 09/29/2013   NA 139 06/27/2013   K 3.9 06/27/2013   CL 103 06/27/2013   CREATININE 0.5 06/27/2013   BUN 15 06/27/2013   CO2 29 06/27/2013    No results found.     Assessment & Plan:   Problem List Items Addressed This Visit    Sinusitis, acute    abx finished Symptoms resolved except for ear pain      Otitis externa    abx finished Pt still using drops She is requesting ent referral secondary to drainage and "odor"       Other Visit Diagnoses    Need for prophylactic vaccination and inoculation against influenza    -  Primary    Relevant Orders    Flu Vaccine QUAD 36+ mos PF IM (Fluarix Quad PF) (Completed)        Garnet Koyanagi, DO

## 2014-05-26 NOTE — Patient Instructions (Signed)

## 2014-05-27 DIAGNOSIS — J019 Acute sinusitis, unspecified: Secondary | ICD-10-CM | POA: Insufficient documentation

## 2014-05-27 DIAGNOSIS — H6090 Unspecified otitis externa, unspecified ear: Secondary | ICD-10-CM | POA: Insufficient documentation

## 2014-05-27 NOTE — Assessment & Plan Note (Signed)
abx finished Pt still using drops She is requesting ent referral secondary to drainage and "odor"

## 2014-05-27 NOTE — Assessment & Plan Note (Signed)
abx finished Symptoms resolved except for ear pain

## 2014-06-15 ENCOUNTER — Telehealth: Payer: Self-pay | Admitting: Family Medicine

## 2014-06-15 MED ORDER — LISINOPRIL 20 MG PO TABS: 20.0000 mg | ORAL_TABLET | Freq: Every day | ORAL | Status: DC

## 2014-06-15 NOTE — Telephone Encounter (Signed)
Caller name: Unita Relation to pt: self Call back number: [phone] Pharmacy: [facility] [address]  Reason for call:   Requesting lisinopril refill

## 2014-06-17 NOTE — Telephone Encounter (Signed)
Patient states that that pharmacy did not receive refill

## 2014-06-18 MED ORDER — LISINOPRIL 20 MG PO TABS: 20.0000 mg | ORAL_TABLET | Freq: Every day | ORAL | Status: DC

## 2014-06-18 NOTE — Telephone Encounter (Signed)
Rx re-sent   KP 

## 2014-06-18 NOTE — Addendum Note (Signed)
Addended by: Ewing Schlein on: 06/18/2014 08:17 AM   Modules accepted: Orders

## 2014-06-30 ENCOUNTER — Telehealth: Payer: Self-pay | Admitting: Family Medicine

## 2014-06-30 DIAGNOSIS — H9202 Otalgia, left ear: Secondary | ICD-10-CM

## 2014-06-30 DIAGNOSIS — H6690 Otitis media, unspecified, unspecified ear: Secondary | ICD-10-CM

## 2014-06-30 NOTE — Telephone Encounter (Signed)
Please advise      KP 

## 2014-06-30 NOTE — Telephone Encounter (Signed)
Caller name: Genae, Strine Relation to pt: self  Call back number: best # [phone]   Reason for call:  Pt stated MD advised pt to call if its reoccurring, pt exper. sharp pain in left ear and would like a referral to ENT.

## 2014-06-30 NOTE — Telephone Encounter (Signed)
Ok to refer to ent 

## 2014-09-10 ENCOUNTER — Encounter: Payer: Self-pay | Admitting: Medical

## 2014-09-10 ENCOUNTER — Ambulatory Visit (INDEPENDENT_AMBULATORY_CARE_PROVIDER_SITE_OTHER): Payer: Commercial Managed Care - HMO | Admitting: Medical

## 2014-09-10 VITALS — BP 122/80 | HR 77 | Temp 98.0°F | Resp 18 | Ht 66.0 in | Wt 122.0 lb

## 2014-09-10 DIAGNOSIS — J309 Allergic rhinitis, unspecified: Secondary | ICD-10-CM | POA: Insufficient documentation

## 2014-09-10 DIAGNOSIS — H6122 Impacted cerumen, left ear: Secondary | ICD-10-CM | POA: Diagnosis not present

## 2014-09-10 DIAGNOSIS — J3089 Other allergic rhinitis: Secondary | ICD-10-CM

## 2014-09-10 DIAGNOSIS — H6120 Impacted cerumen, unspecified ear: Secondary | ICD-10-CM | POA: Insufficient documentation

## 2014-09-10 MED ORDER — FLUTICASONE PROPIONATE 50 MCG/ACT NA SUSP: 2.0000 | Freq: Every day | NASAL | Status: DC

## 2014-09-10 NOTE — Assessment & Plan Note (Signed)
Cleared completely with lavage. Normal tm.  If any recurrent symptoms then return.

## 2014-09-10 NOTE — Patient Instructions (Signed)
Allergic rhinitis By exam turbinates are quite swollen. Intermittent ear pressure can result from this. So rx of flonase.   Cerumen impaction Cleared completely with lavage. Normal tm.  If any recurrent symptoms then return.    Any recurrent symptoms of ears please return so we can re-evaluate.  Follow as regularly scheduled pcp or as needed

## 2014-09-10 NOTE — Progress Notes (Signed)
   Subjective:    Patient ID: Elizabeth Lester, female    DOB: [birth year], 69 y.o.   MRN: [id number]  HPI  Pt in with left ear pain. Pt has been over a week.No preceding cough, congestion, runny nose or sneezing.  Lt ear discomfort. On and off. Not constant ache. No fever and no chills.  In past pt states given antibiotic for lt om but not recently. But discomfort does occur periodically.     Review of Systems  HENT: Positive for ear pain.        Lt ear faint discomfort.  Respiratory: Negative for cough, choking, chest tightness, shortness of breath and wheezing.   Cardiovascular: Negative for chest pain and palpitations.  Neurological: Negative for dizziness, weakness and headaches.  Hematological: Negative for adenopathy. Does not bruise/bleed easily.  Psychiatric/Behavioral: Negative for behavioral problems and confusion.       Objective:   Physical Exam  General Mental Status- Alert. General Appearance- Not in acute distress.   General  Mental Status - Alert. General Appearance - Well groomed. Not in acute distress.  Skin Rashes- No Rashes.  HEENT Head- Normal. Ear Auditory Canal - Left- Normal. Right - Normal.Tympanic Membrane- Left- Wax obstructing view(post lavage tm normal. Right- Normal. Eye Sclera/Conjunctiva- Left- Normal. Right- Normal. Nose & Sinuses Nasal Mucosa- Left-  Boggy + Congested. Right-   boggy + Congested.no sinus pressure Mouth & Throat Lips: Upper Lip- Normal: no dryness, cracking, pallor, cyanosis, or vesicular eruption. Lower Lip-Normal: no dryness, cracking, pallor, cyanosis or vesicular eruption. Buccal Mucosa- Bilateral- No Aphthous ulcers. Oropharynx- No Discharge or Erythema. Tonsils: Characteristics- Bilateral- No Erythema or Congestion. Size/Enlargement- Bilateral- No enlargement. Discharge- bilateral-None.  Neck Neck- Supple. No Masses.   Chest and Lung Exam Auscultation: Breath Sounds:- even and  unlabored  Cardiovascular Auscultation:Rythm- Regular, rate and rhythm. Murmurs & Other Heart Sounds:Ausculatation of the heart reveal- No Murmurs.  Lymphatic Head & Neck General Head & Neck Lymphatics: Bilateral: Description- No Localized lymphadenopathy.            Assessment & Plan:

## 2014-09-10 NOTE — Assessment & Plan Note (Signed)
By exam turbinates are quite swollen. Intermittent ear pressure can result from this. So rx of flonase.

## 2014-09-10 NOTE — Progress Notes (Signed)
Pre visit review using our clinic review tool, if applicable. No additional management support is needed unless otherwise documented below in the visit note. 

## 2014-09-16 DIAGNOSIS — Z1231 Encounter for screening mammogram for malignant neoplasm of breast: Secondary | ICD-10-CM | POA: Diagnosis not present

## 2014-09-16 LAB — HM MAMMOGRAPHY

## 2014-10-02 ENCOUNTER — Encounter: Payer: Self-pay | Admitting: General Practice

## 2014-11-24 ENCOUNTER — Ambulatory Visit (INDEPENDENT_AMBULATORY_CARE_PROVIDER_SITE_OTHER): Payer: Commercial Managed Care - HMO | Admitting: Family Medicine

## 2014-11-24 ENCOUNTER — Encounter: Payer: Self-pay | Admitting: Family Medicine

## 2014-11-24 VITALS — BP 124/76 | HR 84 | Temp 98.1°F | Wt 123.8 lb

## 2014-11-24 DIAGNOSIS — I10 Essential (primary) hypertension: Secondary | ICD-10-CM

## 2014-11-24 DIAGNOSIS — H6122 Impacted cerumen, left ear: Secondary | ICD-10-CM

## 2014-11-24 MED ORDER — LISINOPRIL 20 MG PO TABS: 20.0000 mg | ORAL_TABLET | Freq: Every day | ORAL | Status: DC

## 2014-11-24 NOTE — Progress Notes (Signed)
Patient ID: Elizabeth Lester, female    DOB: 10-02-1945  Age: 69 y.o. MRN: [id number]    Subjective:  Subjective HPI Elizabeth Lester presents for cerumen impaction and f/u bp.  No cp, sob or palpitations.   Review of Systems  Constitutional: Negative for diaphoresis, appetite change, fatigue and unexpected weight change.  HENT: Positive for ear pain. Negative for congestion, postnasal drip, rhinorrhea, sinus pressure, sneezing and sore throat.   Eyes: Negative for pain, redness and visual disturbance.  Respiratory: Negative for cough, chest tightness, shortness of breath and wheezing.   Cardiovascular: Negative for chest pain, palpitations and leg swelling.  Endocrine: Negative for cold intolerance, heat intolerance, polydipsia, polyphagia and polyuria.  Genitourinary: Negative for dysuria, frequency and difficulty urinating.  Neurological: Negative for dizziness, light-headedness, numbness and headaches.  Psychiatric/Behavioral: Negative for dysphoric mood and decreased concentration. The patient is not nervous/anxious.     History Past Medical History  Diagnosis Date  . Hypertension   . Cancer of tongue   . Bell's palsy     She has past surgical history that includes Abdominal hysterectomy.   Her family history includes Cancer in her brother; Cancer (age of onset: 56) in her father; Heart disease in her mother; Hypertension in her mother.She reports that she has been smoking Cigarettes.  She has been smoking about 0.25 packs per day. She has never used smokeless tobacco. She reports that she drinks alcohol. She reports that she does not use illicit drugs.  No current outpatient prescriptions on file prior to visit.   No current facility-administered medications on file prior to visit.     Objective:  Objective Physical Exam  Constitutional: She is oriented to person, place, and time. She appears well-developed and well-nourished.  HENT:  Head: Normocephalic and atraumatic.    Right Ear: External ear and ear canal normal.  Ears:  Eyes: Conjunctivae and EOM are normal.  Neck: Normal range of motion. Neck supple. No JVD present. Carotid bruit is not present. No thyromegaly present.  Cardiovascular: Normal rate, regular rhythm and normal heart sounds.   No murmur heard. Pulmonary/Chest: Effort normal and breath sounds normal. No respiratory distress. She has no wheezes. She has no rales. She exhibits no tenderness.  Musculoskeletal: She exhibits no edema.  Neurological: She is alert and oriented to person, place, and time.  Psychiatric: She has a normal mood and affect.   BP 124/76 mmHg  Pulse 84  Temp(Src) 98.1 F (36.7 C) (Oral)  Wt 123 lb 12.8 oz (56.155 kg)  SpO2 97% Wt Readings from Last 3 Encounters:  11/24/14 123 lb 12.8 oz (56.155 kg)  09/10/14 122 lb (55.339 kg)  05/26/14 128 lb (58.06 kg)     Lab Results  Component Value Date   WBC 8.2 07/14/2013   HGB 13.0 07/14/2013   HCT 39.3 07/14/2013   PLT 130.0* 07/14/2013   GLUCOSE 98 06/27/2013   CHOL 169 06/27/2013   TRIG 102.0 06/27/2013   HDL 70.80 06/27/2013   LDLCALC 78 06/27/2013   ALT 30 09/29/2013   AST 27 09/29/2013   NA 139 06/27/2013   K 3.9 06/27/2013   CL 103 06/27/2013   CREATININE 0.5 06/27/2013   BUN 15 06/27/2013   CO2 29 06/27/2013    No results found.   Assessment & Plan:  Plan I have discontinued Ms. Sneeringer's fluticasone and fluticasone. I am also having her maintain her lisinopril.  Meds ordered this encounter  Medications  . lisinopril (PRINIVIL,ZESTRIL) 20 MG tablet  Sig: Take 1 tablet (20 mg total) by mouth daily.    Dispense:  90 tablet    Refill:  1    Problem List Items Addressed This Visit    None      Follow-up: No Follow-up on file.  Garnet Koyanagi, DO

## 2014-11-24 NOTE — Progress Notes (Signed)
Pre visit review using our clinic review tool, if applicable. No additional management support is needed unless otherwise documented below in the visit note. 

## 2014-11-25 DIAGNOSIS — H60392 Other infective otitis externa, left ear: Secondary | ICD-10-CM | POA: Diagnosis not present

## 2014-11-25 NOTE — Assessment & Plan Note (Signed)
con't lisinopril stable 

## 2014-11-25 NOTE — Patient Instructions (Signed)
Cerumen Impaction °A cerumen impaction is when the wax in your ear forms a plug. This plug usually causes reduced hearing. Sometimes it also causes an earache or dizziness. Removing a cerumen impaction can be difficult and painful. The wax sticks to the ear canal. The canal is sensitive and bleeds easily. If you try to remove a heavy wax buildup with a cotton tipped swab, you may push it in further. °Irrigation with water, suction, and small ear curettes may be used to clear out the wax. If the impaction is fixed to the skin in the ear canal, ear drops may be needed for a few days to loosen the wax. People who build up a lot of wax frequently can use ear wax removal products available in your local drugstore. °SEEK MEDICAL CARE IF:  °You develop an earache, increased hearing loss, or marked dizziness. °Document Released: 05/11/2004 Document Revised: 06/26/2011 Document Reviewed: 07/01/2009 °ExitCare® Patient Information ©2015 [employer]. This information is not intended to replace advice given to you by your health care provider. Make sure you discuss any questions you have with your health care provider. ° °

## 2014-11-25 NOTE — Assessment & Plan Note (Signed)
Wax removed  ? Cotton or other fb in ear--- unable to remove Referred to ENT

## 2014-12-11 DIAGNOSIS — H60392 Other infective otitis externa, left ear: Secondary | ICD-10-CM | POA: Diagnosis not present

## 2014-12-16 DIAGNOSIS — H60392 Other infective otitis externa, left ear: Secondary | ICD-10-CM | POA: Diagnosis not present

## 2015-01-12 DIAGNOSIS — H60392 Other infective otitis externa, left ear: Secondary | ICD-10-CM | POA: Diagnosis not present

## 2015-01-27 DIAGNOSIS — H60392 Other infective otitis externa, left ear: Secondary | ICD-10-CM | POA: Diagnosis not present

## 2015-02-17 ENCOUNTER — Other Ambulatory Visit: Payer: Self-pay | Admitting: Otolaryngology

## 2015-02-17 DIAGNOSIS — H60392 Other infective otitis externa, left ear: Secondary | ICD-10-CM | POA: Diagnosis not present

## 2015-03-02 ENCOUNTER — Other Ambulatory Visit: Payer: Self-pay | Admitting: Otolaryngology

## 2015-03-02 DIAGNOSIS — H6042 Cholesteatoma of left external ear: Secondary | ICD-10-CM | POA: Diagnosis not present

## 2015-03-02 DIAGNOSIS — H60392 Other infective otitis externa, left ear: Secondary | ICD-10-CM | POA: Diagnosis not present

## 2015-03-02 DIAGNOSIS — H7192 Unspecified cholesteatoma, left ear: Secondary | ICD-10-CM | POA: Diagnosis not present

## 2015-03-02 DIAGNOSIS — H60502 Unspecified acute noninfective otitis externa, left ear: Secondary | ICD-10-CM | POA: Diagnosis not present

## 2015-03-27 DIAGNOSIS — Z6820 Body mass index (BMI) 20.0-20.9, adult: Secondary | ICD-10-CM | POA: Diagnosis not present

## 2015-03-27 DIAGNOSIS — I10 Essential (primary) hypertension: Secondary | ICD-10-CM | POA: Diagnosis not present

## 2015-03-27 DIAGNOSIS — Z0000 Encounter for general adult medical examination without abnormal findings: Secondary | ICD-10-CM | POA: Diagnosis not present

## 2015-03-27 DIAGNOSIS — E7800 Pure hypercholesterolemia, unspecified: Secondary | ICD-10-CM | POA: Diagnosis not present

## 2015-05-11 ENCOUNTER — Telehealth: Payer: Self-pay | Admitting: Family Medicine

## 2015-05-11 NOTE — Telephone Encounter (Signed)
Documented.   KP 

## 2015-05-11 NOTE — Telephone Encounter (Signed)
Pt [facility] Scheduled pt for f/u appt per AVS 11/24/14 (appt scheduled for 05/31/15)

## 2015-05-18 DIAGNOSIS — H608X2 Other otitis externa, left ear: Secondary | ICD-10-CM | POA: Diagnosis not present

## 2015-05-27 ENCOUNTER — Other Ambulatory Visit: Payer: Self-pay | Admitting: Family Medicine

## 2015-05-28 ENCOUNTER — Emergency Department (HOSPITAL_COMMUNITY)
Admission: EM | Admit: 2015-05-28 | Discharge: 2015-05-28 | Disposition: A | Discharge status: Home / Self Care | Payer: Commercial Managed Care - HMO | Attending: Emergency Medicine | Admitting: Emergency Medicine

## 2015-05-28 ENCOUNTER — Encounter (HOSPITAL_COMMUNITY): Payer: Self-pay | Admitting: *Deleted

## 2015-05-28 DIAGNOSIS — Z85810 Personal history of malignant neoplasm of tongue: Secondary | ICD-10-CM | POA: Diagnosis not present

## 2015-05-28 DIAGNOSIS — N764 Abscess of vulva: Secondary | ICD-10-CM | POA: Diagnosis not present

## 2015-05-28 DIAGNOSIS — Z79899 Other long term (current) drug therapy: Secondary | ICD-10-CM | POA: Diagnosis not present

## 2015-05-28 DIAGNOSIS — Z8669 Personal history of other diseases of the nervous system and sense organs: Secondary | ICD-10-CM | POA: Diagnosis not present

## 2015-05-28 DIAGNOSIS — I10 Essential (primary) hypertension: Secondary | ICD-10-CM | POA: Insufficient documentation

## 2015-05-28 DIAGNOSIS — F17210 Nicotine dependence, cigarettes, uncomplicated: Secondary | ICD-10-CM | POA: Insufficient documentation

## 2015-05-28 DIAGNOSIS — N907 Vulvar cyst: Secondary | ICD-10-CM | POA: Diagnosis present

## 2015-05-28 MED ORDER — CEPHALEXIN 500 MG PO CAPS: 500.0000 mg | ORAL_CAPSULE | Freq: Four times a day (QID) | ORAL | Status: DC

## 2015-05-28 MED ORDER — OXYCODONE-ACETAMINOPHEN 5-325 MG PO TABS
1.0000 | ORAL_TABLET | Freq: Once | ORAL | Status: AC
  Administered 2015-05-28: 1 via ORAL
  Filled 2015-05-28: qty 1

## 2015-05-28 MED ORDER — BUPIVACAINE HCL (PF) 0.5 % IJ SOLN
10.0000 mL | Freq: Once | INTRAMUSCULAR | Status: DC
  Filled 2015-05-28: qty 30

## 2015-05-28 NOTE — ED Notes (Signed)
Pt reports knot to labial area since last week that has progressively worsened. Unsure if it is an abcess. C/o pain with sitting. Denies any similar symptoms previously.

## 2015-05-28 NOTE — ED Provider Notes (Signed)
CSN that has been worsening over the past week: [id number]     Arrival date & time 05/28/15  1023 History   First MD Initiated Contact with Patient 05/28/15 1304     Chief Complaint  Patient presents with  . Cyst   (Consider location/radiation/quality/duration/timing/severity/associated sxs/prior Treatment) The history is provided by the patient. No language interpreter was used.    Ms. Onstott is a 70 y.o female with a history of hypertension and cancer of the tongue who presents for left labial swelling and tenderness that has worsened over the past week. She reports mild drainage from the wound. Denies having this previously. No treatment prior to arrival.  Denies any fever, chills, dysuria, hematuria, urinary frequency, vaginal bleeding or discharge.  Past Medical History  Diagnosis Date  . Hypertension   . Cancer of tongue ([place])   . Bell's palsy    Past Surgical History  Procedure Laterality Date  . Abdominal hysterectomy    . Jejunostomy feeding tube     Family History  Problem Relation Age of Onset  . Hypertension Mother   . Heart disease Mother     pacemaker  . Cancer Father 92    prostate  . Cancer Brother    Social History  Substance Use Topics  . Smoking status: Current Every Day Smoker -- 0.25 packs/day    Types: Cigarettes  . Smokeless tobacco: Never Used  . Alcohol Use: Yes     Comment: occ   OB History    No data available     Review of Systems  Constitutional: Negative for fever and chills.  Gastrointestinal: Negative for nausea and vomiting.  Genitourinary: Negative for vaginal bleeding and vaginal discharge.  All other systems reviewed and are negative.     Allergies  Review of patient's allergies indicates no known allergies.  Home Medications   Prior to Admission medications   Medication Sig Start Date End Date Taking? Authorizing Provider  lisinopril (PRINIVIL,ZESTRIL) 20 MG tablet TAKE 1 TABLET (20 MG TOTAL) BY MOUTH DAILY. 05/27/15   Yes Alferd Apa Lowne, DO  cephALEXin (KEFLEX) 500 MG capsule Take 1 capsule (500 mg total) by mouth 4 (four) times daily. 05/28/15   Torianna Junio Patel-Mills, PA-C   BP 123/85 mmHg  Pulse 83  Temp(Src) 98.5 F (36.9 C) (Oral)  Resp 18  Ht 5\' 6"  (1.676 m)  Wt 57.153 kg  BMI 20.35 kg/m2  SpO2 98% Physical Exam  Constitutional: She is oriented to person, place, and time. She appears well-developed and well-nourished.  HENT:  Head: Normocephalic and atraumatic.  Eyes: Conjunctivae are normal.  Neck: Normal range of motion. Neck supple.  Cardiovascular: Normal rate, regular rhythm and normal heart sounds.   Pulmonary/Chest: Effort normal and breath sounds normal.  Abdominal: Soft. There is no tenderness.  Genitourinary: Pelvic exam was performed with patient supine.  External vaginal exam: chaperone present -Left labial abscess with active drainage and some induration.   Musculoskeletal: Normal range of motion.  Neurological: She is alert and oriented to person, place, and time.  Skin: Skin is warm and dry.  Nursing note and vitals reviewed.   ED Course  Procedures (including critical care time) INCISION AND DRAINAGE Performed by: Ottie Glazier Consent: Verbal consent obtained. Risks and benefits: risks, benefits and alternatives were discussed Type: abscess Body area: left labia Anesthesia: local infiltration Incision was made with a scalpel. Local anesthetic: marcaine .5% with epinephrine Anesthetic total: 1 ml Complexity: complex Blunt dissection to break up loculations Drainage: purulent  Drainage amount: 3cc's Packing material: no packing was used Patient tolerance: Patient tolerated the procedure well with no immediate complications.   Labs Review Labs Reviewed - No data to display  Imaging Review No results found.   EKG Interpretation None      MDM   Final diagnoses:  Labial abscess   Patient presents fo left labial abscess that has been worsening over the  past week.  I&D performed.  Patient was put on keflex since the abscess was mildly indurated.  I discussed follow up with [facility] outpatient clinic.  Return precautions discussed and patient agrees with plan.  Filed Vitals:   05/28/15 1234 05/28/15 1526  BP: 138/92 123/85  Pulse: 82 83  Temp: 98.5 F (36.9 C) 98.5 F (36.9 C)  Resp: 18 18   Medications  oxyCODONE-acetaminophen (PERCOCET/ROXICET) 5-325 MG per tablet 1 tablet (1 tablet Oral Given 05/28/15 1329)      Ottie Glazier, PA-C 05/29/15 [id number]  Charlesetta Shanks, MD 06/02/15 [phone]

## 2015-05-28 NOTE — Discharge Instructions (Signed)
Abscess Follow-up with women's outpatient clinic. Take antibiotics as prescribed for abscess. Return for fever, chills, increased size and abscess. Apply warm compresses to the area. An abscess is an infected area that contains a collection of pus and debris.It can occur in almost any part of the body. An abscess is also known as a furuncle or boil. CAUSES  An abscess occurs when tissue gets infected. This can occur from blockage of oil or sweat glands, infection of hair follicles, or a minor injury to the skin. As the body tries to fight the infection, pus collects in the area and creates pressure under the skin. This pressure causes pain. People with weakened immune systems have difficulty fighting infections and get certain abscesses more often.  SYMPTOMS Usually an abscess develops on the skin and becomes a painful mass that is red, warm, and tender. If the abscess forms under the skin, you may feel a moveable soft area under the skin. Some abscesses break open (rupture) on their own, but most will continue to get worse without care. The infection can spread deeper into the body and eventually into the bloodstream, causing you to feel ill.  DIAGNOSIS  Your caregiver will take your medical history and perform a physical exam. A sample of fluid may also be taken from the abscess to determine what is causing your infection. TREATMENT  Your caregiver may prescribe antibiotic medicines to fight the infection. However, taking antibiotics alone usually does not cure an abscess. Your caregiver may need to make a small cut (incision) in the abscess to drain the pus. In some cases, gauze is packed into the abscess to reduce pain and to continue draining the area. HOME CARE INSTRUCTIONS   Only take over-the-counter or prescription medicines for pain, discomfort, or fever as directed by your caregiver.  If you were prescribed antibiotics, take them as directed. Finish them even if you start to feel  better.  If gauze is used, follow your caregiver's directions for changing the gauze.  To avoid spreading the infection:  Keep your draining abscess covered with a bandage.  Wash your hands well.  Do not share personal care items, towels, or whirlpools with others.  Avoid skin contact with others.  Keep your skin and clothes clean around the abscess.  Keep all follow-up appointments as directed by your caregiver. SEEK MEDICAL CARE IF:   You have increased pain, swelling, redness, fluid drainage, or bleeding.  You have muscle aches, chills, or a general ill feeling.  You have a fever. MAKE SURE YOU:   Understand these instructions.  Will watch your condition.  Will get help right away if you are not doing well or get worse.   This information is not intended to replace advice given to you by your health care provider. Make sure you discuss any questions you have with your health care provider.   Document Released: 01/11/2005 Document Revised: 10/03/2011 Document Reviewed: 06/16/2011 Elsevier Interactive Patient Education [employer].

## 2015-05-31 ENCOUNTER — Ambulatory Visit: Payer: Commercial Managed Care - HMO | Admitting: Family Medicine

## 2015-06-07 ENCOUNTER — Ambulatory Visit: Payer: Commercial Managed Care - HMO | Admitting: Family Medicine

## 2015-06-18 ENCOUNTER — Ambulatory Visit (INDEPENDENT_AMBULATORY_CARE_PROVIDER_SITE_OTHER): Payer: Commercial Managed Care - HMO | Admitting: Obstetrics and Gynecology

## 2015-06-18 ENCOUNTER — Encounter: Payer: Self-pay | Admitting: Obstetrics and Gynecology

## 2015-06-18 VITALS — BP 159/89 | HR 88 | Temp 98.3°F | Ht 66.0 in | Wt 127.3 lb

## 2015-06-18 DIAGNOSIS — N764 Abscess of vulva: Secondary | ICD-10-CM | POA: Diagnosis not present

## 2015-06-18 NOTE — Progress Notes (Signed)
Patient ID: Elizabeth Lester, female   DOB: [birth year], 70 y.o.   MRN: [id number] 70 yo presenting today as an ED follow up. Patient was seen on 05/28/15 and diagnosed with a left labial abscess. She was treated with I&D and antibiotics. She reports completing the course of antibiotics and feels well. She denies any vulva pain or discomforts. She denies abnormal discharge or vaginal.   Past Medical History  Diagnosis Date  . Hypertension   . Cancer of tongue ([place])   . Bell's palsy    Past Surgical History  Procedure Laterality Date  . Abdominal hysterectomy    . Jejunostomy feeding tube     Family History  Problem Relation Age of Onset  . Hypertension Mother   . Heart disease Mother     pacemaker  . Cancer Father 66    prostate  . Cancer Brother    Social History  Substance Use Topics  . Smoking status: Current Every Day Smoker -- 0.25 packs/day    Types: Cigarettes  . Smokeless tobacco: Never Used  . Alcohol Use: Yes     Comment: occ   ROS See pertinent in HPI  Blood pressure 159/89, pulse 88, temperature 98.3 F (36.8 C), temperature source Oral, height 5\' 6"  (1.676 m), weight 127 lb 4.8 oz (57.743 kg). GENERAL: Well-developed, well-nourished female in no acute distress.  ABDOMEN: Soft, nontender, nondistended. No organomegaly. PELVIC: Normal external female genitalia. Vagina is pink and rugated.  No adnexal mass or tenderness. EXTREMITIES: No cyanosis, clubbing, or edema, 2+ distal pulses.  A/P 70 yo here for follow up on left labia abscess - Area completely healed without evidence of previous abscess - RTC prn - Smoking cessation reviewed with the patient and I advised her to follow up with her PCP regarding hypertension

## 2015-10-19 ENCOUNTER — Other Ambulatory Visit: Payer: Self-pay | Admitting: Family Medicine

## 2015-10-20 NOTE — Telephone Encounter (Signed)
Medication filled to pharmacy as requested.   

## 2015-11-16 ENCOUNTER — Telehealth: Payer: Self-pay

## 2015-11-16 MED ORDER — LISINOPRIL 20 MG PO TABS: 20.0000 mg | ORAL_TABLET | Freq: Every day | ORAL | 0 refills | Status: DC

## 2015-11-16 NOTE — Telephone Encounter (Signed)
Patient call requesting refill for Lisinopril sent to [facility] on [address]   Call patient at [phone]

## 2015-11-16 NOTE — Telephone Encounter (Signed)
30 sent only, she needs to be seen. Please schedule her an apt for htn follow up   KP

## 2015-11-17 NOTE — Telephone Encounter (Signed)
Patient scheduled for next week.

## 2015-11-23 ENCOUNTER — Other Ambulatory Visit: Payer: Self-pay

## 2015-11-23 ENCOUNTER — Encounter: Payer: Self-pay | Admitting: Family Medicine

## 2015-11-23 ENCOUNTER — Ambulatory Visit (INDEPENDENT_AMBULATORY_CARE_PROVIDER_SITE_OTHER): Payer: Commercial Managed Care - HMO | Admitting: Family Medicine

## 2015-11-23 VITALS — BP 136/80 | HR 74 | Temp 98.7°F | Ht 66.0 in | Wt 125.0 lb

## 2015-11-23 DIAGNOSIS — I10 Essential (primary) hypertension: Secondary | ICD-10-CM | POA: Diagnosis not present

## 2015-11-23 DIAGNOSIS — R748 Abnormal levels of other serum enzymes: Secondary | ICD-10-CM

## 2015-11-23 DIAGNOSIS — Z23 Encounter for immunization: Secondary | ICD-10-CM

## 2015-11-23 DIAGNOSIS — D691 Qualitative platelet defects: Secondary | ICD-10-CM | POA: Diagnosis not present

## 2015-11-23 DIAGNOSIS — H9209 Otalgia, unspecified ear: Secondary | ICD-10-CM | POA: Diagnosis not present

## 2015-11-23 DIAGNOSIS — R6889 Other general symptoms and signs: Secondary | ICD-10-CM | POA: Diagnosis not present

## 2015-11-23 LAB — CBC WITH DIFFERENTIAL/PLATELET
BASOS PCT: 0.4 % (ref 0.0–3.0)
Basophils Absolute: 0 10*3/uL (ref 0.0–0.1)
EOS ABS: 0.1 10*3/uL (ref 0.0–0.7)
EOS PCT: 2 % (ref 0.0–5.0)
HCT: 39.7 % (ref 36.0–46.0)
HEMOGLOBIN: 13.1 g/dL (ref 12.0–15.0)
LYMPHS ABS: 1.8 10*3/uL (ref 0.7–4.0)
Lymphocytes Relative: 25.4 % (ref 12.0–46.0)
MCHC: 33.1 g/dL (ref 30.0–36.0)
MCV: 101.2 fl — ABNORMAL HIGH (ref 78.0–100.0)
MONO ABS: 0.7 10*3/uL (ref 0.1–1.0)
Monocytes Relative: 9.8 % (ref 3.0–12.0)
NEUTROS PCT: 62.4 % (ref 43.0–77.0)
Neutro Abs: 4.4 10*3/uL (ref 1.4–7.7)
PLATELETS: 127 10*3/uL — AB (ref 150.0–400.0)
RBC: 3.92 Mil/uL (ref 3.87–5.11)
RDW: 15.9 % — AB (ref 11.5–15.5)
WBC: 7.1 10*3/uL (ref 4.0–10.5)

## 2015-11-23 LAB — COMPREHENSIVE METABOLIC PANEL
ALT: 23 U/L (ref 0–35)
AST: 27 U/L (ref 0–37)
Albumin: 4.1 g/dL (ref 3.5–5.2)
Alkaline Phosphatase: 79 U/L (ref 39–117)
BUN: 10 mg/dL (ref 6–23)
CHLORIDE: 105 meq/L (ref 96–112)
CO2: 31 meq/L (ref 19–32)
CREATININE: 0.5 mg/dL (ref 0.40–1.20)
Calcium: 9.8 mg/dL (ref 8.4–10.5)
GFR: 157.67 mL/min (ref >60.00)
GLUCOSE: 105 mg/dL — AB (ref 70–99)
POTASSIUM: 4.4 meq/L (ref 3.5–5.1)
SODIUM: 143 meq/L (ref 135–145)
Total Bilirubin: 0.4 mg/dL (ref 0.2–1.2)
Total Protein: 7.2 g/dL (ref 6.0–8.3)

## 2015-11-23 MED ORDER — LISINOPRIL 20 MG PO TABS: 20.0000 mg | ORAL_TABLET | Freq: Every day | ORAL | 1 refills | Status: DC

## 2015-11-23 NOTE — Progress Notes (Unsigned)
Pre visit review using our clinic review tool, if applicable. No additional management support is needed unless otherwise documented below in the visit note. 

## 2015-11-23 NOTE — Assessment & Plan Note (Signed)
Stable con't lisinopril  

## 2015-11-23 NOTE — Patient Instructions (Signed)
Hypertension Hypertension, commonly called high blood pressure, is when the force of blood pumping through your arteries is too strong. Your arteries are the blood vessels that carry blood from your heart throughout your body. A blood pressure reading consists of a higher number over a lower number, such as 110/72. The higher number (systolic) is the pressure inside your arteries when your heart pumps. The lower number (diastolic) is the pressure inside your arteries when your heart relaxes. Ideally you want your blood pressure below 120/80. Hypertension forces your heart to work harder to pump blood. Your arteries may become narrow or stiff. Having untreated or uncontrolled hypertension can cause heart attack, stroke, kidney disease, and other problems. RISK FACTORS Some risk factors for high blood pressure are controllable. Others are not.  Risk factors you cannot control include:   Race. You may be at higher risk if you are African American.  Age. Risk increases with age.  Gender. Men are at higher risk than women before age 45 years. After age 65, women are at higher risk than men. Risk factors you can control include:  Not getting enough exercise or physical activity.  Being overweight.  Getting too much fat, sugar, calories, or salt in your diet.  Drinking too much alcohol. SIGNS AND SYMPTOMS Hypertension does not usually cause signs or symptoms. Extremely high blood pressure (hypertensive crisis) may cause headache, anxiety, shortness of breath, and nosebleed. DIAGNOSIS To check if you have hypertension, your health care provider will measure your blood pressure while you are seated, with your arm held at the level of your heart. It should be measured at least twice using the same arm. Certain conditions can cause a difference in blood pressure between your right and left arms. A blood pressure reading that is higher than normal on one occasion does not mean that you need treatment. If  it is not clear whether you have high blood pressure, you may be asked to return on a different day to have your blood pressure checked again. Or, you may be asked to monitor your blood pressure at home for 1 or more weeks. TREATMENT Treating high blood pressure includes making lifestyle changes and possibly taking medicine. Living a healthy lifestyle can help lower high blood pressure. You may need to change some of your habits. Lifestyle changes may include:  Following the DASH diet. This diet is high in fruits, vegetables, and whole grains. It is low in salt, red meat, and added sugars.  Keep your sodium intake below 2,300 mg per day.  Getting at least 30-45 minutes of aerobic exercise at least 4 times per week.  Losing weight if necessary.  Not smoking.  Limiting alcoholic beverages.  Learning ways to reduce stress. Your health care provider may prescribe medicine if lifestyle changes are not enough to get your blood pressure under control, and if one of the following is true:  You are 18-59 years of age and your systolic blood pressure is above 140.  You are 60 years of age or older, and your systolic blood pressure is above 150.  Your diastolic blood pressure is above 90.  You have diabetes, and your systolic blood pressure is over 140 or your diastolic blood pressure is over 90.  You have kidney disease and your blood pressure is above 140/90.  You have heart disease and your blood pressure is above 140/90. Your personal target blood pressure may vary depending on your medical conditions, your age, and other factors. HOME CARE INSTRUCTIONS    Have your blood pressure rechecked as directed by your health care provider.   Take medicines only as directed by your health care provider. Follow the directions carefully. Blood pressure medicines must be taken as prescribed. The medicine does not work as well when you skip doses. Skipping doses also puts you at risk for  problems.  Do not smoke.   Monitor your blood pressure at home as directed by your health care provider. SEEK MEDICAL CARE IF:   You think you are having a reaction to medicines taken.  You have recurrent headaches or feel dizzy.  You have swelling in your ankles.  You have trouble with your vision. SEEK IMMEDIATE MEDICAL CARE IF:  You develop a severe headache or confusion.  You have unusual weakness, numbness, or feel faint.  You have severe chest or abdominal pain.  You vomit repeatedly.  You have trouble breathing. MAKE SURE YOU:   Understand these instructions.  Will watch your condition.  Will get help right away if you are not doing well or get worse.   This information is not intended to replace advice given to you by your health care provider. Make sure you discuss any questions you have with your health care provider.   Document Released: 04/03/2005 Document Revised: 08/18/2014 Document Reviewed: 01/24/2013 Elsevier Interactive Patient Education 2016 Elsevier Inc.  

## 2015-11-23 NOTE — Progress Notes (Signed)
Patient ID: Elizabeth Lester, female    DOB: [birth year]  Age: 70 y.o. MRN: [id number]    Subjective:  Subjective  HPI Elizabeth Lester presents for f/u bp --- she c/o ear pain again and wanted to f/u with ent but was told she needed a new referral  Review of Systems  Constitutional: Negative for activity change, appetite change, fatigue and unexpected weight change.  Respiratory: Negative for cough and shortness of breath.   Cardiovascular: Negative for chest pain and palpitations.  Psychiatric/Behavioral: Negative for behavioral problems and dysphoric mood. The patient is not nervous/anxious.     History Past Medical History:  Diagnosis Date  . Bell's palsy   . Cancer of tongue ([place])   . Hypertension     She has a past surgical history that includes Abdominal hysterectomy and Jejunostomy feeding tube.   Her family history includes Cancer in her brother; Cancer (age of onset: 32) in her father; Heart disease in her mother; Hypertension in her mother.She reports that she has been smoking Cigarettes.  She has been smoking about 0.25 packs per day. She has never used smokeless tobacco. She reports that she drinks alcohol. She reports that she does not use drugs.  No current outpatient prescriptions on file prior to visit.   No current facility-administered medications on file prior to visit.      Objective:  Objective  Physical Exam  Constitutional: She is oriented to person, place, and time. She appears well-developed and well-nourished.  HENT:  Head: Normocephalic and atraumatic.  Eyes: Conjunctivae and EOM are normal.  Neck: Normal range of motion. Neck supple. No JVD present. Carotid bruit is not present. No thyromegaly present.  Cardiovascular: Normal rate, regular rhythm and normal heart sounds.   No murmur heard. Pulmonary/Chest: Effort normal and breath sounds normal. No respiratory distress. She has no wheezes. She has no rales. She exhibits no tenderness.  Musculoskeletal:  She exhibits no edema.  Neurological: She is alert and oriented to person, place, and time.  Psychiatric: She has a normal mood and affect.   BP 136/80 (BP Location: Left Arm, Patient Position: Sitting, Cuff Size: Small)   Pulse 74   Temp 98.7 F (37.1 C) (Oral)   Ht 5\' 6"  (1.676 m)   Wt 125 lb (56.7 kg)   SpO2 95%   BMI 20.18 kg/m  Wt Readings from Last 3 Encounters:  11/23/15 125 lb (56.7 kg)  06/18/15 127 lb 4.8 oz (57.7 kg)  05/28/15 126 lb (57.2 kg)     Lab Results  Component Value Date   WBC 8.2 07/14/2013   HGB 13.0 07/14/2013   HCT 39.3 07/14/2013   PLT 130.0 (L) 07/14/2013   GLUCOSE 98 06/27/2013   CHOL 169 06/27/2013   TRIG 102.0 06/27/2013   HDL 70.80 06/27/2013   LDLCALC 78 06/27/2013   ALT 30 09/29/2013   AST 27 09/29/2013   NA 139 06/27/2013   K 3.9 06/27/2013   CL 103 06/27/2013   CREATININE 0.5 06/27/2013   BUN 15 06/27/2013   CO2 29 06/27/2013    No results found.   Assessment & Plan:  Plan  I have changed Elizabeth Lester's lisinopril.  Meds ordered this encounter  Medications  . lisinopril (PRINIVIL,ZESTRIL) 20 MG tablet    Sig: Take 1 tablet (20 mg total) by mouth daily.    Dispense:  90 tablet    Refill:  1    Problem List Items Addressed This Visit      Unprioritized  Essential (primary) hypertension    Stable  con't lisinopril      Relevant Medications   lisinopril (PRINIVIL,ZESTRIL) 20 MG tablet    Other Visit Diagnoses    Essential hypertension    -  Primary   Relevant Medications   lisinopril (PRINIVIL,ZESTRIL) 20 MG tablet   Other Relevant Orders   CBC with Differential/Platelet   Comprehensive metabolic panel   Elevated liver enzymes       Relevant Orders   CBC with Differential/Platelet   Comprehensive metabolic panel   Abnormal platelets (HCC)       Relevant Orders   CBC with Differential/Platelet   Otalgia, unspecified laterality       Relevant Orders   Ambulatory referral to ENT      Follow-up: Return in  about 6 months (around 05/25/2016), or if symptoms worsen or fail to improve, for annual exam, fasting.  Ann Held, DO

## 2015-11-23 NOTE — Addendum Note (Signed)
Addended by: Marjory Lies on: 11/23/2015 05:13 PM   Modules accepted: Orders

## 2015-11-23 NOTE — Progress Notes (Signed)
Pre visit review using our clinic review tool, if applicable. No additional management support is needed unless otherwise documented below in the visit note. 

## 2015-11-29 DIAGNOSIS — Z1231 Encounter for screening mammogram for malignant neoplasm of breast: Secondary | ICD-10-CM | POA: Diagnosis not present

## 2015-11-29 LAB — HM MAMMOGRAPHY

## 2015-11-30 ENCOUNTER — Other Ambulatory Visit: Payer: Self-pay

## 2015-11-30 DIAGNOSIS — H60333 Swimmer's ear, bilateral: Secondary | ICD-10-CM | POA: Diagnosis not present

## 2015-11-30 DIAGNOSIS — D696 Thrombocytopenia, unspecified: Secondary | ICD-10-CM

## 2015-11-30 DIAGNOSIS — H6983 Other specified disorders of Eustachian tube, bilateral: Secondary | ICD-10-CM | POA: Diagnosis not present

## 2015-11-30 NOTE — Progress Notes (Signed)
Ref

## 2015-12-16 DIAGNOSIS — H60333 Swimmer's ear, bilateral: Secondary | ICD-10-CM | POA: Diagnosis not present

## 2015-12-16 DIAGNOSIS — R6889 Other general symptoms and signs: Secondary | ICD-10-CM | POA: Diagnosis not present

## 2015-12-16 DIAGNOSIS — H6983 Other specified disorders of Eustachian tube, bilateral: Secondary | ICD-10-CM | POA: Diagnosis not present

## 2015-12-22 ENCOUNTER — Encounter: Payer: Self-pay | Admitting: Family Medicine

## 2015-12-27 ENCOUNTER — Ambulatory Visit: Payer: Commercial Managed Care - HMO

## 2015-12-27 ENCOUNTER — Encounter: Payer: Self-pay | Admitting: Hematology & Oncology

## 2015-12-27 ENCOUNTER — Other Ambulatory Visit (HOSPITAL_BASED_OUTPATIENT_CLINIC_OR_DEPARTMENT_OTHER): Payer: Commercial Managed Care - HMO

## 2015-12-27 ENCOUNTER — Ambulatory Visit (HOSPITAL_BASED_OUTPATIENT_CLINIC_OR_DEPARTMENT_OTHER): Payer: Commercial Managed Care - HMO | Admitting: Hematology & Oncology

## 2015-12-27 VITALS — BP 125/74 | HR 74 | Temp 97.9°F | Resp 16 | Ht 66.0 in | Wt 124.1 lb

## 2015-12-27 DIAGNOSIS — R6889 Other general symptoms and signs: Secondary | ICD-10-CM | POA: Diagnosis not present

## 2015-12-27 DIAGNOSIS — D696 Thrombocytopenia, unspecified: Secondary | ICD-10-CM

## 2015-12-27 DIAGNOSIS — B182 Chronic viral hepatitis C: Secondary | ICD-10-CM

## 2015-12-27 DIAGNOSIS — Z7289 Other problems related to lifestyle: Secondary | ICD-10-CM | POA: Diagnosis not present

## 2015-12-27 LAB — CBC WITH DIFFERENTIAL (CANCER CENTER ONLY)
BASO#: 0 10*3/uL (ref 0.0–0.2)
BASO%: 0.5 % (ref 0.0–2.0)
EOS%: 3.1 % (ref 0.0–7.0)
Eosinophils Absolute: 0.2 10*3/uL (ref 0.0–0.5)
HEMATOCRIT: 39 % (ref 34.8–46.6)
HGB: 13.1 g/dL (ref 11.6–15.9)
LYMPH#: 1.7 10*3/uL (ref 0.9–3.3)
LYMPH%: 27.5 % (ref 14.0–48.0)
MCH: 33.6 pg (ref 26.0–34.0)
MCHC: 33.6 g/dL (ref 32.0–36.0)
MCV: 100 fL (ref 81–101)
MONO#: 0.8 10*3/uL (ref 0.1–0.9)
MONO%: 12.6 % (ref 0.0–13.0)
NEUT#: 3.4 10*3/uL (ref 1.5–6.5)
NEUT%: 56.3 % (ref 39.6–80.0)
RBC: 3.9 10*6/uL (ref 3.70–5.32)
RDW: 13.5 % (ref 11.1–15.7)
WBC: 6.1 10*3/uL (ref 3.9–10.0)

## 2015-12-27 LAB — CHCC SATELLITE - SMEAR

## 2015-12-27 NOTE — Progress Notes (Signed)
Referral MD  Reason for Referral: Chronic thrombocytopenia                                     History of stage IVA (T1N2M0) SCCa BOT                                   Hepatitis C   Chief Complaint  Patient presents with  . Other    New Patient  : My platelets are low.  HPI: Ms. Facemire is a very charming 70 year old African-American female. She is followed by Dr. Etter Sjogren.  She is a very interesting history. 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 This was HPV (-). She was treated with radiation and chemotherapy at [facility].  There is also a mention in the chart that she has hepatitis C.  She is still smoking. She is a heavy smoker. She probably has a 50-pack-year history of tobacco use.  She has had thrombocytopenia. Go back through her records, she's been noted to have pancytopenia for 10 years. Back in 2007, platelet was 77,000. His been on the lower side.  Most recently, her platelet was found be 127,000. This was about a month ago. She is not anemic or thrombocytopenic.  She has had no problems with bleeding or bruising. She's had no weight loss or weight gain. She eats okay. She's had all of her teeth taken out, which I think was probably done prior to the past treatments for her head and neck cancer.  There is no history in the family of any blood problem. There is no history of sickle cell.  She does not think there is any history of malignancy in the family.  She has had no past surgeries. She has 5 children. They are all girls. She had no problems with her pregnancy or delivery.  Dr. Etter Sjogren felt that she needed to be seen by hematology to make sure that nothing was being overlooked.  It is hard to say how much alcohol she drinks. I think she probably does have some alcohol use. She does have an occasional glass of gin. She had some vodka yesterday for her daughter's birthday.  I must say, that she actually looks quite good. She  really did well with her radiation chemotherapy for the base of tongue cancer. This was now over 7 years ago and she is in remission.  I would have to say, that her current performance status is ECOG 1.    Past Medical History:  Diagnosis Date  . Bell's palsy   . Cancer of tongue ([place])   . Hypertension   :  Past Surgical History:  Procedure Laterality Date  . ABDOMINAL HYSTERECTOMY    . JEJUNOSTOMY FEEDING TUBE    :   Current Outpatient Prescriptions:  .  lisinopril (PRINIVIL,ZESTRIL) 20 MG tablet, Take 1 tablet (20 mg total) by mouth daily., Disp: 90 tablet, Rfl: 1:  :  No Known Allergies:  Family History  Problem Relation Age of Onset  . Hypertension Mother   . Heart disease Mother     pacemaker  . Cancer Father 43    prostate  . Cancer Brother   :  Social History   Social History  . Marital status: Single    Spouse name: N/A  .  Number of children: N/A  . Years of education: N/A   Occupational History  . Not on file.   Social History Main Topics  . Smoking status: Current Every Day Smoker    Packs/day: 0.25    Types: Cigarettes  . Smokeless tobacco: Never Used  . Alcohol use Yes     Comment: occ  . Drug use: No  . Sexual activity: No   Other Topics Concern  . Not on file   Social History Narrative  . No narrative on file  :  Pertinent items are noted in HPI.  Exam: '@IPVITALS'$ @ [place] African-American female in no obvious distress. Vital signs show temperature of 97.9. Pulse 74. Blood pressure 125/74. Weight is 124 pounds. Her head and neck exam shows edentulous oral cavity. She has no obvious oral lesions. She has no palpable cervical or supraclavicular lymph nodes. There is no scleral icterus. Lungs are clear bilaterally. Cardiac exam regular rate and rhythm with no murmurs, rubs or bruits. Abdomen is soft. She has good bowel sounds. There is the healing PEG tube site in the left upper quadrant. She has no fluid wave. There is no palpable liver  or spleen tip. Back exam shows no tenderness over the spine, ribs or hips. Extremities shows no clubbing, cyanosis or edema. Neurological exam shows no focal neurological deficits. Skin exam shows no rashes, ecchymoses or petechia.   Recent Labs  12/27/15 1334  WBC 6.1  HGB 13.1  HCT 39.0  PLT 129 Platelet count consistent in citrate*   No results for input(s): NA, K, CL, CO2, GLUCOSE, BUN, CREATININE, CALCIUM in the last 72 hours.  Blood smear review:  Normochromic and normocytic palpation of red blood cells. There are no nucleated red blood cells. I see no teardrop cells. There is no rouleau formation. There are no schistocytes or spherocytes. White cells per normal in morphology and maturation. There is no immature myeloid or lymphoid forms. There is no hyper segmented polys. Platelets are minimally decreased in number. She has a couple large platelets. Platelets are well granulated.   Pathology: None     Assessment and Plan:  Ms. Freeman is a very nice 70 year old after American female. She has mild thrombocytopenia.  I have to believe that this is multi-factorial. If she has hepatitis C, then I think this be the most likely reason for the thrombocytopenia. I'm sure that this is being dilated by her family doctor.  She has had therapy for head and neck cancer. It is possible that treatment that she had for the headache cancer might affect her platelets. She weekly cis-platinum which typically does not have that kind of lasting bone marrow effect.   I don't know about alcohol use. Again she does have some alcohol use. This may also be a factor.  I do not believe she has an underlying hematologic issue. As such, I do not think that she needs a bone marrow biopsy.  She is very nice. I enjoyed talking to her.  I will like to see her back in 6 months. It will be interesting to see what her platelet count looks like then.  Again, I think that if she truly has hepatitis C, this is the  most likely reason for her to have mild thrombocytopenia.  I answered all of her questions. I spent about 45 minutes with her.

## 2016-01-06 DIAGNOSIS — R6889 Other general symptoms and signs: Secondary | ICD-10-CM | POA: Diagnosis not present

## 2016-01-06 DIAGNOSIS — H6042 Cholesteatoma of left external ear: Secondary | ICD-10-CM | POA: Diagnosis not present

## 2016-01-06 DIAGNOSIS — H6983 Other specified disorders of Eustachian tube, bilateral: Secondary | ICD-10-CM | POA: Diagnosis not present

## 2016-01-18 ENCOUNTER — Other Ambulatory Visit: Payer: Self-pay

## 2016-01-18 DIAGNOSIS — H6062 Unspecified chronic otitis externa, left ear: Secondary | ICD-10-CM | POA: Diagnosis not present

## 2016-01-18 DIAGNOSIS — H6983 Other specified disorders of Eustachian tube, bilateral: Secondary | ICD-10-CM | POA: Diagnosis not present

## 2016-01-18 DIAGNOSIS — H6042 Cholesteatoma of left external ear: Secondary | ICD-10-CM | POA: Diagnosis not present

## 2016-01-18 DIAGNOSIS — H6993 Unspecified Eustachian tube disorder, bilateral: Secondary | ICD-10-CM | POA: Diagnosis not present

## 2016-02-25 ENCOUNTER — Encounter: Payer: Self-pay | Admitting: Family Medicine

## 2016-02-25 ENCOUNTER — Ambulatory Visit (INDEPENDENT_AMBULATORY_CARE_PROVIDER_SITE_OTHER): Payer: Commercial Managed Care - HMO | Admitting: Family Medicine

## 2016-02-25 VITALS — BP 110/70 | HR 84 | Temp 98.4°F | Resp 16 | Ht 66.0 in | Wt 125.4 lb

## 2016-02-25 DIAGNOSIS — I10 Essential (primary) hypertension: Secondary | ICD-10-CM

## 2016-02-25 DIAGNOSIS — Z889 Allergy status to unspecified drugs, medicaments and biological substances status: Secondary | ICD-10-CM

## 2016-02-25 MED ORDER — FLUTICASONE PROPIONATE 50 MCG/ACT NA SUSP: 2.0000 | Freq: Every day | NASAL | 6 refills | Status: DC

## 2016-02-25 MED ORDER — LORATADINE 10 MG PO TABS: 10.0000 mg | ORAL_TABLET | Freq: Every day | ORAL | 11 refills | Status: AC

## 2016-02-25 MED ORDER — LISINOPRIL 20 MG PO TABS: 20.0000 mg | ORAL_TABLET | Freq: Every day | ORAL | 1 refills | Status: AC

## 2016-02-25 NOTE — Progress Notes (Signed)
Pre visit review using our clinic review tool, if applicable. No additional management support is needed unless otherwise documented below in the visit note. 

## 2016-02-25 NOTE — Progress Notes (Signed)
Patient ID: Elizabeth Lester, female    DOB: July 20, 1945  Age: 70 y.o. MRN: [id number]    Subjective:  Subjective  HPI Elizabeth Lester presents for f/u and c/o some congestion. No fever.  No cough.    Review of Systems  Constitutional: Negative for chills and fever.  HENT: Positive for congestion, postnasal drip and rhinorrhea. Negative for sinus pressure.   Respiratory: Negative for cough, chest tightness, shortness of breath and wheezing.   Cardiovascular: Negative for chest pain, palpitations and leg swelling.  Allergic/Immunologic: Negative for environmental allergies.    History Past Medical History:  Diagnosis Date  . Bell's palsy   . Cancer of tongue ([place])   . Hypertension     She has a past surgical history that includes Abdominal hysterectomy and Jejunostomy feeding tube.   Her family history includes Cancer in her brother; Cancer (age of onset: 49) in her father; Heart disease in her mother; Hypertension in her mother.She reports that she has been smoking Cigarettes.  She has been smoking about 0.25 packs per day. She has never used smokeless tobacco. She reports that she drinks alcohol. She reports that she does not use drugs.  No current outpatient prescriptions on file prior to visit.   No current facility-administered medications on file prior to visit.      Objective:  Objective  Physical Exam  Constitutional: She is oriented to person, place, and time. She appears well-developed and well-nourished.  HENT:  Head: Normocephalic and atraumatic.  Eyes: Conjunctivae and EOM are normal.  Neck: Normal range of motion. Neck supple. No JVD present. Carotid bruit is not present. No thyromegaly present.  Cardiovascular: Normal rate, regular rhythm and normal heart sounds.   No murmur heard. Pulmonary/Chest: Effort normal and breath sounds normal. No respiratory distress. She has no wheezes. She has no rales. She exhibits no tenderness.  Musculoskeletal: She exhibits no edema.   Neurological: She is alert and oriented to person, place, and time.  Psychiatric: She has a normal mood and affect.  Nursing note and vitals reviewed.  BP 110/70 (BP Location: Left Arm, Patient Position: Sitting, Cuff Size: Normal)   Pulse 84   Temp 98.4 F (36.9 C) (Oral)   Resp 16   Ht 5\' 6"  (1.676 m)   Wt 125 lb 6.4 oz (56.9 kg)   SpO2 98%   BMI 20.24 kg/m  Wt Readings from Last 3 Encounters:  02/25/16 125 lb 6.4 oz (56.9 kg)  12/27/15 124 lb 1.3 oz (56.3 kg)  11/23/15 125 lb (56.7 kg)     Lab Results  Component Value Date   WBC 6.1 12/27/2015   HGB 13.1 12/27/2015   HCT 39.0 12/27/2015   PLT 129 Platelet count consistent in citrate (L) 12/27/2015   GLUCOSE 105 (H) 11/23/2015   CHOL 169 06/27/2013   TRIG 102.0 06/27/2013   HDL 70.80 06/27/2013   LDLCALC 78 06/27/2013   ALT 23 11/23/2015   AST 27 11/23/2015   NA 143 11/23/2015   K 4.4 11/23/2015   CL 105 11/23/2015   CREATININE 0.50 11/23/2015   BUN 10 11/23/2015   CO2 31 11/23/2015    No results found.   Assessment & Plan:  Plan  I am having Ms. Goertzen start on fluticasone and loratadine. I am also having her maintain her lisinopril.  Meds ordered this encounter  Medications  . lisinopril (PRINIVIL,ZESTRIL) 20 MG tablet    Sig: Take 1 tablet (20 mg total) by mouth daily.    Dispense:  90 tablet    Refill:  1  . fluticasone (FLONASE) 50 MCG/ACT nasal spray    Sig: Place 2 sprays into both nostrils daily.    Dispense:  16 g    Refill:  6  . loratadine (CLARITIN) 10 MG tablet    Sig: Take 1 tablet (10 mg total) by mouth daily.    Dispense:  30 tablet    Refill:  11    Problem List Items Addressed This Visit      Unprioritized   Essential (primary) hypertension    meds refilled Stable Pt will transfer to a pcp closer to home      Relevant Medications   lisinopril (PRINIVIL,ZESTRIL) 20 MG tablet    Other Visit Diagnoses    H/O seasonal allergies    -  Primary   Relevant Medications    fluticasone (FLONASE) 50 MCG/ACT nasal spray   loratadine (CLARITIN) 10 MG tablet   Essential hypertension       Relevant Medications   lisinopril (PRINIVIL,ZESTRIL) 20 MG tablet      Follow-up: No Follow-up on file.  Ann Held, DO

## 2016-02-25 NOTE — Patient Instructions (Signed)
Upper Respiratory Infection, Adult Most upper respiratory infections (URIs) are a viral infection of the air passages leading to the lungs. A URI affects the nose, throat, and upper air passages. The most common type of URI is nasopharyngitis and is typically referred to as "the common cold." URIs run their course and usually go away on their own. Most of the time, a URI does not require medical attention, but sometimes a bacterial infection in the upper airways can follow a viral infection. This is called a secondary infection. Sinus and middle ear infections are common types of secondary upper respiratory infections. Bacterial pneumonia can also complicate a URI. A URI can worsen asthma and chronic obstructive pulmonary disease (COPD). Sometimes, these complications can require emergency medical care and may be life threatening.  CAUSES Almost all URIs are caused by viruses. A virus is a type of germ and can spread from one person to another.  RISKS FACTORS You may be at risk for a URI if:   You smoke.   You have chronic heart or lung disease.  You have a weakened defense (immune) system.   You are very young or very old.   You have nasal allergies or asthma.  You work in crowded or poorly ventilated areas.  You work in health care facilities or schools. SIGNS AND SYMPTOMS  Symptoms typically develop 2-3 days after you come in contact with a cold virus. Most viral URIs last 7-10 days. However, viral URIs from the influenza virus (flu virus) can last 14-18 days and are typically more severe. Symptoms may include:   Runny or stuffy (congested) nose.   Sneezing.   Cough.   Sore throat.   Headache.   Fatigue.   Fever.   Loss of appetite.   Pain in your forehead, behind your eyes, and over your cheekbones (sinus pain).  Muscle aches.  DIAGNOSIS  Your health care provider may diagnose a URI by:  Physical exam.  Tests to check that your symptoms are not due to  another condition such as:  Strep throat.  Sinusitis.  Pneumonia.  Asthma. TREATMENT  A URI goes away on its own with time. It cannot be cured with medicines, but medicines may be prescribed or recommended to relieve symptoms. Medicines may help:  Reduce your fever.  Reduce your cough.  Relieve nasal congestion. HOME CARE INSTRUCTIONS   Take medicines only as directed by your health care provider.   Gargle warm saltwater or take cough drops to comfort your throat as directed by your health care provider.  Use a warm mist humidifier or inhale steam from a shower to increase air moisture. This may make it easier to breathe.  Drink enough fluid to keep your urine clear or pale yellow.   Eat soups and other clear broths and maintain good nutrition.   Rest as needed.   Return to work when your temperature has returned to normal or as your health care provider advises. You may need to stay home longer to avoid infecting others. You can also use a face mask and careful hand washing to prevent spread of the virus.  Increase the usage of your inhaler if you have asthma.   Do not use any tobacco products, including cigarettes, chewing tobacco, or electronic cigarettes. If you need help quitting, ask your health care provider. PREVENTION  The best way to protect yourself from getting a cold is to practice good hygiene.   Avoid oral or hand contact with people with cold   symptoms.   Wash your hands often if contact occurs.  There is no clear evidence that vitamin C, vitamin E, echinacea, or exercise reduces the chance of developing a cold. However, it is always recommended to get plenty of rest, exercise, and practice good nutrition.  SEEK MEDICAL CARE IF:   You are getting worse rather than better.   Your symptoms are not controlled by medicine.   You have chills.  You have worsening shortness of breath.  You have brown or red mucus.  You have yellow or brown nasal  discharge.  You have pain in your face, especially when you bend forward.  You have a fever.  You have swollen neck glands.  You have pain while swallowing.  You have white areas in the back of your throat. SEEK IMMEDIATE MEDICAL CARE IF:   You have severe or persistent:  Headache.  Ear pain.  Sinus pain.  Chest pain.  You have chronic lung disease and any of the following:  Wheezing.  Prolonged cough.  Coughing up blood.  A change in your usual mucus.  You have a stiff neck.  You have changes in your:  Vision.  Hearing.  Thinking.  Mood. MAKE SURE YOU:   Understand these instructions.  Will watch your condition.  Will get help right away if you are not doing well or get worse.   This information is not intended to replace advice given to you by your health care provider. Make sure you discuss any questions you have with your health care provider.   Document Released: 09/27/2000 Document Revised: 08/18/2014 Document Reviewed: 07/09/2013 Elsevier Interactive Patient Education 2016 Elsevier Inc.  

## 2016-02-26 NOTE — Assessment & Plan Note (Signed)
meds refilled Stable Pt will transfer to a pcp closer to home

## 2016-03-03 DIAGNOSIS — R6889 Other general symptoms and signs: Secondary | ICD-10-CM | POA: Diagnosis not present

## 2016-05-01 ENCOUNTER — Other Ambulatory Visit: Payer: Self-pay

## 2016-05-01 DIAGNOSIS — Z889 Allergy status to unspecified drugs, medicaments and biological substances status: Secondary | ICD-10-CM

## 2016-05-01 MED ORDER — FLUTICASONE PROPIONATE 50 MCG/ACT NA SUSP: 2.0000 | Freq: Every day | NASAL | 6 refills | Status: AC

## 2016-05-10 DIAGNOSIS — Z720 Tobacco use: Secondary | ICD-10-CM | POA: Diagnosis not present

## 2016-05-10 DIAGNOSIS — I10 Essential (primary) hypertension: Secondary | ICD-10-CM | POA: Diagnosis not present

## 2016-05-15 ENCOUNTER — Telehealth: Payer: Self-pay | Admitting: Family Medicine

## 2016-05-15 NOTE — Telephone Encounter (Signed)
Spoke to patient. She stated that she is no longer a patient of Dr. Etter Sjogren. Patient stated she is a patient of Dr. Kelton Pillar.

## 2016-06-12 DIAGNOSIS — H6042 Cholesteatoma of left external ear: Secondary | ICD-10-CM | POA: Diagnosis not present

## 2016-06-26 ENCOUNTER — Other Ambulatory Visit: Payer: Medicare HMO

## 2016-06-26 ENCOUNTER — Ambulatory Visit: Payer: Medicare HMO | Admitting: Family

## 2016-09-07 ENCOUNTER — Emergency Department (HOSPITAL_BASED_OUTPATIENT_CLINIC_OR_DEPARTMENT_OTHER): Admit: 2016-09-07 | Discharge: 2016-09-07 | Disposition: A | Discharge status: Home / Self Care | Payer: Medicare HMO

## 2016-09-07 ENCOUNTER — Emergency Department (HOSPITAL_COMMUNITY)
Admission: EM | Admit: 2016-09-07 | Discharge: 2016-09-07 | Disposition: A | Discharge status: Home / Self Care | Payer: Medicare HMO | Attending: Emergency Medicine | Admitting: Emergency Medicine

## 2016-09-07 ENCOUNTER — Encounter (HOSPITAL_COMMUNITY): Payer: Self-pay | Admitting: Emergency Medicine

## 2016-09-07 DIAGNOSIS — M79601 Pain in right arm: Secondary | ICD-10-CM | POA: Diagnosis not present

## 2016-09-07 DIAGNOSIS — M79621 Pain in right upper arm: Secondary | ICD-10-CM | POA: Diagnosis not present

## 2016-09-07 DIAGNOSIS — F17210 Nicotine dependence, cigarettes, uncomplicated: Secondary | ICD-10-CM | POA: Diagnosis not present

## 2016-09-07 DIAGNOSIS — Z79899 Other long term (current) drug therapy: Secondary | ICD-10-CM | POA: Insufficient documentation

## 2016-09-07 DIAGNOSIS — Z85810 Personal history of malignant neoplasm of tongue: Secondary | ICD-10-CM | POA: Insufficient documentation

## 2016-09-07 DIAGNOSIS — M79609 Pain in unspecified limb: Secondary | ICD-10-CM

## 2016-09-07 DIAGNOSIS — I10 Essential (primary) hypertension: Secondary | ICD-10-CM | POA: Diagnosis not present

## 2016-09-07 MED ORDER — TRAMADOL HCL 50 MG PO TABS
50.0000 mg | ORAL_TABLET | Freq: Four times a day (QID) | ORAL | 0 refills | Status: AC | PRN
Start: 2016-09-07 — End: ?

## 2016-09-07 MED ORDER — TRAMADOL HCL 50 MG PO TABS
50.0000 mg | ORAL_TABLET | Freq: Once | ORAL | Status: AC
  Administered 2016-09-07: 50 mg via ORAL
  Filled 2016-09-07: qty 1

## 2016-09-07 NOTE — ED Notes (Signed)
Vascular tech at bedside. °

## 2016-09-07 NOTE — ED Provider Notes (Signed)
[facility] DEPT Provider Note   CSN: [id number] Arrival date & time: 09/07/16  1432     History   Chief Complaint No chief complaint on file.   HPI Chrystel Barefield is a 71 y.o. female.  HPI   71 year old female with hx of tongue cancer, Bell's palsy and HTN present c/o R arm/shoulder pain.  Pt developed gradual onset of R arm pain started from forearm to shoulder.  Pain is sharp, worse with certain movement, and she also notice swelling to arm.  Pain is moderate in intensity.  No report of fever, headache, neck pain, cp, sob, numbness or weakness.  She is R hand dominant.  She did report performing housework but no more than usual.  Remote hx of tongue cancer, in remission.  No cp, sob, dizziness, nausea, or diaphoresis.  No specific treatment tried.    Past Medical History:  Diagnosis Date  . Bell's palsy   . Cancer of tongue ([place])   . Hypertension     Patient Active Problem List   Diagnosis Date Noted  . Cerumen impaction 09/10/2014  . Allergic rhinitis 09/10/2014  . Otitis externa 05/27/2014  . Sinusitis, acute 05/27/2014  . Dizziness 03/26/2013  . Medication refill 03/26/2013  . Malignant neoplasm of head, face and neck ([place]) 03/20/2012  . Essential (primary) hypertension 01/09/2011    Past Surgical History:  Procedure Laterality Date  . ABDOMINAL HYSTERECTOMY    . DENTAL SURGERY    . JEJUNOSTOMY FEEDING TUBE      OB History    No data available       Home Medications    Prior to Admission medications   Medication Sig Start Date End Date Taking? Authorizing Provider  fluticasone (FLONASE) 50 MCG/ACT nasal spray Place 2 sprays into both nostrils daily. 05/01/16   Ann Held, DO  lisinopril (PRINIVIL,ZESTRIL) 20 MG tablet Take 1 tablet (20 mg total) by mouth daily. 02/25/16   Ann Held, DO  loratadine (CLARITIN) 10 MG tablet Take 1 tablet (10 mg total) by mouth daily. 02/25/16   Ann Held, DO    Family History Family  History  Problem Relation Age of Onset  . Hypertension Mother   . Heart disease Mother        pacemaker  . Cancer Father 78       prostate  . Cancer Brother     Social History Social History  Substance Use Topics  . Smoking status: Current Every Day Smoker    Packs/day: 0.25    Types: Cigarettes  . Smokeless tobacco: Never Used  . Alcohol use Yes     Comment: occ     Allergies   Patient has no known allergies.   Review of Systems Review of Systems  Constitutional: Negative for fever.  Neurological: Negative for numbness.  All other systems reviewed and are negative.    Physical Exam Updated Vital Signs BP (!) 145/79 (BP Location: Left Arm)   Pulse 85   Temp 98.5 F (36.9 C) (Oral)   Resp 14   Ht 5\' 6"  (1.676 m)   Wt 59 kg (130 lb)   SpO2 100%   BMI 20.98 kg/m   Physical Exam  Constitutional: She appears well-developed and well-nourished. No distress.  HENT:  Head: Atraumatic.  Eyes: Conjunctivae are normal.  Neck: Normal range of motion. Neck supple.  No midline cervical spine tenderness.  Cardiovascular: Normal rate, regular rhythm and intact distal pulses.  Pulmonary/Chest: Effort normal and breath sounds normal. No respiratory distress. She has no wheezes.  Abdominal: Soft. There is no tenderness.  Musculoskeletal: She exhibits tenderness (R arm: tenderness along forearm, most significant to proximal forearm and around bicep.  mild edema noted.  No redness, compartment is soft.  radial pulse 2+.  normal grip strength.).  Neurological: She is alert.  Skin: No rash noted.  Psychiatric: She has a normal mood and affect.  Nursing note and vitals reviewed.    ED Treatments / Results  Labs (all labs ordered are listed, but only abnormal results are displayed) Labs Reviewed - No data to display  EKG  EKG Interpretation None       Radiology No results found.  Procedures Procedures (including critical care time)  *PRELIMINARY  RESULTS* Vascular Ultrasound Right upper extremity venous duplex has been completed.  Preliminary findings: The visualized veins of the right upper extremity appear negative for deep and superficial vein thrombosis.  Preliminary results given to Dr. Regenia Skeeter and Domenic Moras at 16:40.  Everrett Coombe 09/07/2016, 4:40 PM     Electronically signed by Everrett Coombe, RVT at 09/07/2016 4:41 PM      UE VENOUS on 09/07/2016        Detailed Report       Medications Ordered in ED Medications  traMADol (ULTRAM) tablet 50 mg (50 mg Oral Given 09/07/16 1551)     Initial Impression / Assessment and Plan / ED Course  I have reviewed the triage vital signs and the nursing notes.  Pertinent labs & imaging results that were available during my care of the patient were reviewed by me and considered in my medical decision making (see chart for details).     BP (!) 145/79 (BP Location: Left Arm)   Pulse 85   Temp 98.5 F (36.9 C) (Oral)   Resp 14   Ht 5\' 6"  (1.676 m)   Wt 59 kg (130 lb)   SpO2 100%   BMI 20.98 kg/m    Final Clinical Impressions(s) / ED Diagnoses   Final diagnoses:  Right arm pain    New Prescriptions New Prescriptions   TRAMADOL (ULTRAM) 50 MG TABLET    Take 1 tablet (50 mg total) by mouth every 6 (six) hours as needed.   3:35 PM Pt here with reproducible R arm pain.  Likely MSK as it is reproducible.  However, with trace edema and hx of CA, will obtain DVT study.  Doubt ACS causing pain or cervical radiculopathy.  Care discussed with Dr. Regenia Skeeter  4:51 PM DVT study is negative.  Pain likely MSK, including tendinitis.  Doubt infectious cause.  Will treat with conservative management and recommend outpt f/u.     Domenic Moras, PA-C 09/07/16 1654    Sherwood Gambler, MD 09/08/16 [phone]

## 2016-09-07 NOTE — ED Notes (Signed)
ED Provider at bedside. 

## 2016-09-07 NOTE — Progress Notes (Signed)
*  PRELIMINARY RESULTS* Vascular Ultrasound Right upper extremity venous duplex has been completed.  Preliminary findings: The visualized veins of the right upper extremity appear negative for deep and superficial vein thrombosis.  Preliminary results given to Dr. Regenia Skeeter and Domenic Moras at 16:40.  Everrett Coombe 09/07/2016, 4:40 PM

## 2016-09-07 NOTE — ED Triage Notes (Signed)
Pt reports pain in r/arm and shoulder x 24 hours. Tx with Motrin x 1 at 1130. Denies fall, denies headache. Denies chest pain.

## 2016-09-12 DIAGNOSIS — R6889 Other general symptoms and signs: Secondary | ICD-10-CM | POA: Diagnosis not present

## 2016-09-12 DIAGNOSIS — H6042 Cholesteatoma of left external ear: Secondary | ICD-10-CM | POA: Diagnosis not present

## 2016-11-08 DIAGNOSIS — Z1389 Encounter for screening for other disorder: Secondary | ICD-10-CM | POA: Diagnosis not present

## 2016-11-08 DIAGNOSIS — Z0000 Encounter for general adult medical examination without abnormal findings: Secondary | ICD-10-CM | POA: Diagnosis not present

## 2016-11-08 DIAGNOSIS — I10 Essential (primary) hypertension: Secondary | ICD-10-CM | POA: Diagnosis not present

## 2016-11-09 ENCOUNTER — Telehealth: Payer: Self-pay | Admitting: Family Medicine

## 2016-11-09 NOTE — Telephone Encounter (Signed)
Elizabeth Lester with Dr. Delene Ruffini office called office for Dr. Etter Sjogren requesting copy of last bone density. She received other medical records of OV notes, etc but did not get bone density. Faxed her results  Caller name: Elizabeth Lester Can be reached: [phone] Fax: [phone]

## 2016-11-29 DIAGNOSIS — Z1231 Encounter for screening mammogram for malignant neoplasm of breast: Secondary | ICD-10-CM | POA: Diagnosis not present

## 2016-11-29 DIAGNOSIS — R6889 Other general symptoms and signs: Secondary | ICD-10-CM | POA: Diagnosis not present

## 2016-12-27 DIAGNOSIS — M8588 Other specified disorders of bone density and structure, other site: Secondary | ICD-10-CM | POA: Diagnosis not present

## 2016-12-27 DIAGNOSIS — R6889 Other general symptoms and signs: Secondary | ICD-10-CM | POA: Diagnosis not present

## 2016-12-27 DIAGNOSIS — Z780 Asymptomatic menopausal state: Secondary | ICD-10-CM | POA: Diagnosis not present

## 2017-01-17 ENCOUNTER — Encounter: Payer: Self-pay | Admitting: Hematology & Oncology

## 2017-01-17 NOTE — Progress Notes (Unsigned)
Faxed medical records to: [employer] P: [phone] Req ID: [id number] [place]: 04/18/2015-Present

## 2017-03-19 DIAGNOSIS — R6889 Other general symptoms and signs: Secondary | ICD-10-CM | POA: Diagnosis not present

## 2017-03-19 DIAGNOSIS — H6042 Cholesteatoma of left external ear: Secondary | ICD-10-CM | POA: Diagnosis not present

## 2017-05-11 DIAGNOSIS — I10 Essential (primary) hypertension: Secondary | ICD-10-CM | POA: Diagnosis not present

## 2017-05-11 DIAGNOSIS — R6889 Other general symptoms and signs: Secondary | ICD-10-CM | POA: Diagnosis not present

## 2017-05-11 DIAGNOSIS — Z23 Encounter for immunization: Secondary | ICD-10-CM | POA: Diagnosis not present

## 2017-07-20 ENCOUNTER — Other Ambulatory Visit: Payer: Self-pay

## 2017-07-20 ENCOUNTER — Emergency Department (HOSPITAL_COMMUNITY): Payer: Medicare HMO

## 2017-07-20 ENCOUNTER — Emergency Department (HOSPITAL_COMMUNITY)
Admission: EM | Admit: 2017-07-20 | Discharge: 2017-07-20 | Disposition: A | Discharge status: Home / Self Care | Payer: Medicare HMO | Attending: Emergency Medicine | Admitting: Emergency Medicine

## 2017-07-20 ENCOUNTER — Encounter (HOSPITAL_COMMUNITY): Payer: Self-pay

## 2017-07-20 DIAGNOSIS — I700 Atherosclerosis of aorta: Secondary | ICD-10-CM | POA: Diagnosis not present

## 2017-07-20 DIAGNOSIS — Z85810 Personal history of malignant neoplasm of tongue: Secondary | ICD-10-CM | POA: Insufficient documentation

## 2017-07-20 DIAGNOSIS — Z79899 Other long term (current) drug therapy: Secondary | ICD-10-CM | POA: Insufficient documentation

## 2017-07-20 DIAGNOSIS — R079 Chest pain, unspecified: Secondary | ICD-10-CM | POA: Diagnosis not present

## 2017-07-20 DIAGNOSIS — I10 Essential (primary) hypertension: Secondary | ICD-10-CM | POA: Diagnosis not present

## 2017-07-20 DIAGNOSIS — R0781 Pleurodynia: Secondary | ICD-10-CM | POA: Diagnosis not present

## 2017-07-20 DIAGNOSIS — Z85828 Personal history of other malignant neoplasm of skin: Secondary | ICD-10-CM | POA: Diagnosis not present

## 2017-07-20 DIAGNOSIS — F17210 Nicotine dependence, cigarettes, uncomplicated: Secondary | ICD-10-CM | POA: Diagnosis not present

## 2017-07-20 LAB — BASIC METABOLIC PANEL
Anion gap: 8 (ref 5–15)
BUN: 13 mg/dL (ref 6–20)
CHLORIDE: 106 mmol/L (ref 101–111)
CO2: 28 mmol/L (ref 22–32)
Calcium: 9.1 mg/dL (ref 8.9–10.3)
Creatinine, Ser: 0.59 mg/dL (ref 0.44–1.00)
GFR calc Af Amer: >60 mL/min (ref >60)
GFR calc non Af Amer: >60 mL/min (ref >60)
Glucose, Bld: 130 mg/dL — ABNORMAL HIGH (ref 65–99)
POTASSIUM: 3.6 mmol/L (ref 3.5–5.1)
SODIUM: 142 mmol/L (ref 135–145)

## 2017-07-20 LAB — CBC
HEMATOCRIT: 37.5 % (ref 36.0–46.0)
Hemoglobin: 12.2 g/dL (ref 12.0–15.0)
MCH: 31.9 pg (ref 26.0–34.0)
MCHC: 32.5 g/dL (ref 30.0–36.0)
MCV: 98.2 fL (ref 78.0–100.0)
Platelets: 133 10*3/uL — ABNORMAL LOW (ref 150–400)
RBC: 3.82 MIL/uL — ABNORMAL LOW (ref 3.87–5.11)
RDW: 14.4 % (ref 11.5–15.5)
WBC: 9.7 10*3/uL (ref 4.0–10.5)

## 2017-07-20 LAB — I-STAT TROPONIN, ED
TROPONIN I, POC: 0 ng/mL (ref 0.00–0.08)
Troponin i, poc: 0 ng/mL (ref 0.00–0.08)

## 2017-07-20 MED ORDER — ACETAMINOPHEN 325 MG PO TABS
650.0000 mg | ORAL_TABLET | Freq: Once | ORAL | Status: AC
  Administered 2017-07-20: 650 mg via ORAL
  Filled 2017-07-20: qty 2

## 2017-07-20 MED ORDER — IOPAMIDOL (ISOVUE-370) INJECTION 76%
100.0000 mL | Freq: Once | INTRAVENOUS | Status: AC | PRN
  Administered 2017-07-20: 100 mL via INTRAVENOUS

## 2017-07-20 MED ORDER — IOPAMIDOL (ISOVUE-370) INJECTION 76%
INTRAVENOUS | Status: AC
  Filled 2017-07-20: qty 100

## 2017-07-20 MED ORDER — ASPIRIN 81 MG PO CHEW
324.0000 mg | CHEWABLE_TABLET | Freq: Once | ORAL | Status: AC
  Administered 2017-07-20: 324 mg via ORAL
  Filled 2017-07-20: qty 4

## 2017-07-20 NOTE — ED Triage Notes (Signed)
Pt c/o mid chest pain since 4 am today, sts hurts when taking a deep breath.

## 2017-07-20 NOTE — ED Notes (Signed)
Pt is alert and oriented x 4 and is verbally responsive. Pt reports that she has mid sternal chest pain that started this morning at 4 am . Pt reports that pain has not gotten worse or better 8/10 sharp pain  that worsens with deep breaths. Pt denies similar pain in the past. Denies n/v or abdominal pain. Pt is accompanied with DTR.

## 2017-07-20 NOTE — Discharge Instructions (Addendum)
We are unsure what is causing the chest pain when you take those deep breaths.  It is possible that you have a little muscle spasm.  Please use Tylenol at home.  Please continue to watch her symptoms as we discussed, will only see me at one moment in time.  If you have changes in your symptoms or other concerns please return immediately to the emergency department.

## 2017-07-20 NOTE — ED Provider Notes (Signed)
Second troponin negative.  Discharge with instructions given to patient per Dr. Lin Landsman, MD 07/20/17 [phone]

## 2017-07-20 NOTE — ED Notes (Signed)
Patient transported to CT 

## 2017-07-20 NOTE — ED Provider Notes (Signed)
[facility] DEPT Provider Note   CSN: [id number] Arrival date & time: 07/20/17  1112     History   Chief Complaint Chief Complaint  Patient presents with  . Chest Pain  . Cough    HPI Gidget Quizhpi is a 72 y.o. female.  HPI   Patient is a 72 year old female with past medical history significant for tongue cancer, hypertension.  Patient is presenting today with chest pain radiating to her back.  She reports that it is an 8 out of 10 and she woke up with it.  She reports that the pain is in the cetner of her chest and radiates to her back between her shoulder blades.  Patient reports increased shortness of breath with it.  And a small dry cough.  Denies any radiation of the pain.  Denies any diaphoresis.  Denies any preceding symptoms.  Patient's not tried anything to make it better or worse.  Patient states its pleuritic nature.  Only bad with breaths.  Otherwise not present.  Past Medical History:  Diagnosis Date  . Bell's palsy   . Cancer of tongue ([place])   . Hypertension     Patient Active Problem List   Diagnosis Date Noted  . Cerumen impaction 09/10/2014  . Allergic rhinitis 09/10/2014  . Otitis externa 05/27/2014  . Sinusitis, acute 05/27/2014  . Dizziness 03/26/2013  . Medication refill 03/26/2013  . Malignant neoplasm of head, face and neck ([place]) 03/20/2012  . Essential (primary) hypertension 01/09/2011    Past Surgical History:  Procedure Laterality Date  . ABDOMINAL HYSTERECTOMY    . DENTAL SURGERY    . JEJUNOSTOMY FEEDING TUBE       OB History   None      Home Medications    Prior to Admission medications   Medication Sig Start Date End Date Taking? Authorizing Provider  lisinopril (PRINIVIL,ZESTRIL) 20 MG tablet Take 1 tablet (20 mg total) by mouth daily. 02/25/16  Yes Lowne Chase, Yvonne R, DO  OVER THE COUNTER MEDICATION Take 15 mLs by mouth 3 (three) times daily. LIQUID VITAMIN C AND CALCIUM FROM HEALTH FOOD STORE    Yes [provider]  fluticasone (FLONASE) 50 MCG/ACT nasal spray Place 2 sprays into both nostrils daily. Patient not taking: Reported on 07/20/2017 05/01/16   Carollee Herter, Alferd Apa, DO  loratadine (CLARITIN) 10 MG tablet Take 1 tablet (10 mg total) by mouth daily. Patient not taking: Reported on 07/20/2017 02/25/16   Carollee Herter, Alferd Apa, DO  traMADol (ULTRAM) 50 MG tablet Take 1 tablet (50 mg total) by mouth every 6 (six) hours as needed. Patient not taking: Reported on 07/20/2017 09/07/16   Domenic Moras, PA-C    Family History Family History  Problem Relation Age of Onset  . Hypertension Mother   . Heart disease Mother        pacemaker  . Cancer Father 72       prostate  . Cancer Brother     Social History Social History   Tobacco Use  . Smoking status: Current Every Day Smoker    Packs/day: 0.25    Types: Cigarettes  . Smokeless tobacco: Never Used  Substance Use Topics  . Alcohol use: Yes    Comment: occ  . Drug use: No     Allergies   Patient has no known allergies.   Review of Systems Review of Systems  Constitutional: Negative for activity change, fatigue and fever.  Respiratory: Positive for cough. Negative  for shortness of breath.   Cardiovascular: Positive for chest pain.  Gastrointestinal: Negative for abdominal pain.  All other systems reviewed and are negative.    Physical Exam Updated Vital Signs BP 122/77   Pulse 86   Temp 99.1 F (37.3 C) (Oral)   Resp (!) 23   Ht 5\' 6"  (1.676 m)   Wt 56.7 kg (125 lb)   SpO2 99%   BMI 20.18 kg/m   Physical Exam  Constitutional: She is oriented to person, place, and time. She appears well-developed and well-nourished.  HENT:  Head: Normocephalic and atraumatic.  assymetric face.  Eyes: Right eye exhibits no discharge.  Cardiovascular: Normal rate, regular rhythm, normal heart sounds, intact distal pulses and normal pulses.  No murmur heard. Pulmonary/Chest: Effort normal and breath sounds  normal. She has no wheezes. She has no rales.  Abdominal: Soft. She exhibits no distension. There is no tenderness.  Neurological: She is oriented to person, place, and time.  Skin: Skin is warm and dry. She is not diaphoretic.  Psychiatric: She has a normal mood and affect.  Nursing note and vitals reviewed.    ED Treatments / Results  Labs (all labs ordered are listed, but only abnormal results are displayed) Labs Reviewed  BASIC METABOLIC PANEL - Abnormal; Notable for the following components:      Result Value   Glucose, Bld 130 (*)    All other components within normal limits  CBC - Abnormal; Notable for the following components:   RBC 3.82 (*)    Platelets 133 (*)    All other components within normal limits  I-STAT TROPONIN, ED    EKG EKG Interpretation  Date/Time:  Friday July 20 2017 11:31:38 EDT Ventricular Rate:  96 PR Interval:    QRS Duration: 70 QT Interval:  339 QTC Calculation: 429 R Axis:   65 Text Interpretation:  Sinus rhythm ST elevation, consider inferior injury Baseline wander in lead(s) V6 Normal sinus rhythm Confirmed by Zenovia Jarred [phone]) on 07/20/2017 12:28:04 PM   Radiology Dg Chest 2 View  Result Date: 07/20/2017 CLINICAL DATA:  Mid sternal chest pain beginning today. EXAM: CHEST - 2 VIEW COMPARISON:  07/22/2012.  04/26/2005. FINDINGS: Heart size is normal. The aorta shows atherosclerotic calcification and tortuosity. The pulmonary vascularity is normal. No edema or effusions. Right upper lobe paratracheal patchy density could be scarring or pneumonia. I think this finding was probably present in 2014, favoring scarring. No significant bony finding. IMPRESSION: Chronic aortic atherosclerosis and tortuosity of the aorta. Patchy density in the medial right upper lobe which I think was present in 2014, though not in 2007. This is most consistent with chronic scarring. Cannot rule out some patchy pneumonia in this region. Electronically Signed    By: Nelson Chimes M.D.   On: 07/20/2017 12:48    Procedures Procedures (including critical care time)  Medications Ordered in ED Medications  iopamidol (ISOVUE-370) 76 % injection (has no administration in time range)  iopamidol (ISOVUE-370) 76 % injection 100 mL (100 mLs Intravenous Contrast Given 07/20/17 1322)     Initial Impression / Assessment and Plan / ED Course  I have reviewed the triage vital signs and the nursing notes.  Pertinent labs & imaging results that were available during my care of the patient were reviewed by me and considered in my medical decision making (see chart for details).    Patient is a 72 year old female with past medical history significant for tongue cancer, hypertension.  Patient is  presenting today with chest pain radiating to her back.  She reports that it is an 8 out of 10 and she woke up with it.  She reports that the pain is in the cetner of her chest and radiates to her back between her shoulder blades.  Patient reports increased shortness of breath with it.  And a small dry cough.  Denies any radiation of the pain.  Denies any diaphoresis.  Denies any preceding symptoms.  Patient's not tried anything to make it better or worse.  Patient states its pleuritic nature.  Only bad with breaths.  Otherwise not present.  2:35 PM Initially was concerned because patient was radiating to her back.  I did CT dissection study.  Negative.  Initial troponin and EKG are nonischemic.  Patient's heart score is really just 3 for age and hypertension and smoking.  Sounds very atypical for cardiac chest pain.  It is only pleuritic in nature.  I suspect small virus or something could be causing pain.  Will plan to do delta troponin to discharge home with follow-up with primary care physician.  Final Clinical Impressions(s) / ED Diagnoses   Final diagnoses:  None    ED Discharge Orders    None       Macarthur Critchley, MD 07/20/17 1608

## 2017-09-24 DIAGNOSIS — R6889 Other general symptoms and signs: Secondary | ICD-10-CM | POA: Diagnosis not present

## 2017-09-24 DIAGNOSIS — H6042 Cholesteatoma of left external ear: Secondary | ICD-10-CM | POA: Diagnosis not present

## 2017-11-26 DIAGNOSIS — C029 Malignant neoplasm of tongue, unspecified: Secondary | ICD-10-CM | POA: Diagnosis not present

## 2017-11-26 DIAGNOSIS — I10 Essential (primary) hypertension: Secondary | ICD-10-CM | POA: Diagnosis not present

## 2017-11-26 DIAGNOSIS — Z0000 Encounter for general adult medical examination without abnormal findings: Secondary | ICD-10-CM | POA: Diagnosis not present

## 2017-11-26 DIAGNOSIS — E46 Unspecified protein-calorie malnutrition: Secondary | ICD-10-CM | POA: Diagnosis not present

## 2017-11-26 DIAGNOSIS — Z1389 Encounter for screening for other disorder: Secondary | ICD-10-CM | POA: Diagnosis not present

## 2017-11-26 DIAGNOSIS — Z1159 Encounter for screening for other viral diseases: Secondary | ICD-10-CM | POA: Diagnosis not present

## 2017-12-03 DIAGNOSIS — Z1159 Encounter for screening for other viral diseases: Secondary | ICD-10-CM | POA: Diagnosis not present

## 2017-12-04 DIAGNOSIS — R6889 Other general symptoms and signs: Secondary | ICD-10-CM | POA: Diagnosis not present

## 2017-12-04 DIAGNOSIS — Z1231 Encounter for screening mammogram for malignant neoplasm of breast: Secondary | ICD-10-CM | POA: Diagnosis not present

## 2017-12-11 DIAGNOSIS — R928 Other abnormal and inconclusive findings on diagnostic imaging of breast: Secondary | ICD-10-CM | POA: Diagnosis not present

## 2017-12-11 DIAGNOSIS — R6889 Other general symptoms and signs: Secondary | ICD-10-CM | POA: Diagnosis not present

## 2018-01-18 DIAGNOSIS — R6889 Other general symptoms and signs: Secondary | ICD-10-CM | POA: Diagnosis not present

## 2018-01-18 DIAGNOSIS — R748 Abnormal levels of other serum enzymes: Secondary | ICD-10-CM | POA: Diagnosis not present

## 2018-03-20 DIAGNOSIS — R6889 Other general symptoms and signs: Secondary | ICD-10-CM | POA: Diagnosis not present

## 2018-03-20 DIAGNOSIS — H6983 Other specified disorders of Eustachian tube, bilateral: Secondary | ICD-10-CM | POA: Diagnosis not present

## 2018-03-20 DIAGNOSIS — H6042 Cholesteatoma of left external ear: Secondary | ICD-10-CM | POA: Diagnosis not present

## 2018-10-24 DIAGNOSIS — R6889 Other general symptoms and signs: Secondary | ICD-10-CM | POA: Diagnosis not present

## 2018-10-24 DIAGNOSIS — H6042 Cholesteatoma of left external ear: Secondary | ICD-10-CM | POA: Diagnosis not present

## 2018-11-28 DIAGNOSIS — C029 Malignant neoplasm of tongue, unspecified: Secondary | ICD-10-CM | POA: Diagnosis not present

## 2018-11-28 DIAGNOSIS — Z1389 Encounter for screening for other disorder: Secondary | ICD-10-CM | POA: Diagnosis not present

## 2018-11-28 DIAGNOSIS — E46 Unspecified protein-calorie malnutrition: Secondary | ICD-10-CM | POA: Diagnosis not present

## 2018-11-28 DIAGNOSIS — I10 Essential (primary) hypertension: Secondary | ICD-10-CM | POA: Diagnosis not present

## 2018-11-28 DIAGNOSIS — Z0000 Encounter for general adult medical examination without abnormal findings: Secondary | ICD-10-CM | POA: Diagnosis not present

## 2018-12-05 DIAGNOSIS — I10 Essential (primary) hypertension: Secondary | ICD-10-CM | POA: Diagnosis not present

## 2018-12-05 DIAGNOSIS — R6889 Other general symptoms and signs: Secondary | ICD-10-CM | POA: Diagnosis not present

## 2018-12-05 DIAGNOSIS — E46 Unspecified protein-calorie malnutrition: Secondary | ICD-10-CM | POA: Diagnosis not present

## 2018-12-12 DIAGNOSIS — Z1231 Encounter for screening mammogram for malignant neoplasm of breast: Secondary | ICD-10-CM | POA: Diagnosis not present

## 2018-12-12 DIAGNOSIS — R6889 Other general symptoms and signs: Secondary | ICD-10-CM | POA: Diagnosis not present

## 2019-03-10 DIAGNOSIS — R6889 Other general symptoms and signs: Secondary | ICD-10-CM | POA: Diagnosis not present

## 2019-03-10 DIAGNOSIS — H6042 Cholesteatoma of left external ear: Secondary | ICD-10-CM | POA: Diagnosis not present

## 2019-03-26 DIAGNOSIS — R6889 Other general symptoms and signs: Secondary | ICD-10-CM | POA: Diagnosis not present

## 2019-03-26 DIAGNOSIS — H6042 Cholesteatoma of left external ear: Secondary | ICD-10-CM | POA: Diagnosis not present

## 2019-06-03 DIAGNOSIS — Z7189 Other specified counseling: Secondary | ICD-10-CM | POA: Diagnosis not present

## 2019-06-03 DIAGNOSIS — I10 Essential (primary) hypertension: Secondary | ICD-10-CM | POA: Diagnosis not present

## 2019-06-03 DIAGNOSIS — Z23 Encounter for immunization: Secondary | ICD-10-CM | POA: Diagnosis not present

## 2019-06-03 DIAGNOSIS — R6889 Other general symptoms and signs: Secondary | ICD-10-CM | POA: Diagnosis not present

## 2019-07-22 DIAGNOSIS — H6042 Cholesteatoma of left external ear: Secondary | ICD-10-CM | POA: Diagnosis not present

## 2019-07-22 DIAGNOSIS — R6889 Other general symptoms and signs: Secondary | ICD-10-CM | POA: Diagnosis not present

## 2019-12-01 DIAGNOSIS — R6889 Other general symptoms and signs: Secondary | ICD-10-CM | POA: Diagnosis not present

## 2019-12-01 DIAGNOSIS — H6042 Cholesteatoma of left external ear: Secondary | ICD-10-CM | POA: Diagnosis not present

## 2019-12-03 DIAGNOSIS — Z0000 Encounter for general adult medical examination without abnormal findings: Secondary | ICD-10-CM | POA: Diagnosis not present

## 2019-12-03 DIAGNOSIS — M8588 Other specified disorders of bone density and structure, other site: Secondary | ICD-10-CM | POA: Diagnosis not present

## 2019-12-03 DIAGNOSIS — Z1389 Encounter for screening for other disorder: Secondary | ICD-10-CM | POA: Diagnosis not present

## 2019-12-03 DIAGNOSIS — E46 Unspecified protein-calorie malnutrition: Secondary | ICD-10-CM | POA: Diagnosis not present

## 2019-12-03 DIAGNOSIS — I10 Essential (primary) hypertension: Secondary | ICD-10-CM | POA: Diagnosis not present

## 2019-12-03 DIAGNOSIS — R748 Abnormal levels of other serum enzymes: Secondary | ICD-10-CM | POA: Diagnosis not present

## 2019-12-03 DIAGNOSIS — C029 Malignant neoplasm of tongue, unspecified: Secondary | ICD-10-CM | POA: Diagnosis not present

## 2019-12-03 DIAGNOSIS — R6889 Other general symptoms and signs: Secondary | ICD-10-CM | POA: Diagnosis not present

## 2019-12-09 IMAGING — CR DG CHEST 2V
2 series · 2 of 2 positions shown · non-contrast
Comparison: 07/22/2012.  04/26/2005.

CLINICAL DATA: Mid sternal chest pain beginning today.

EXAM:
CHEST - 2 VIEW

[w chest pa]
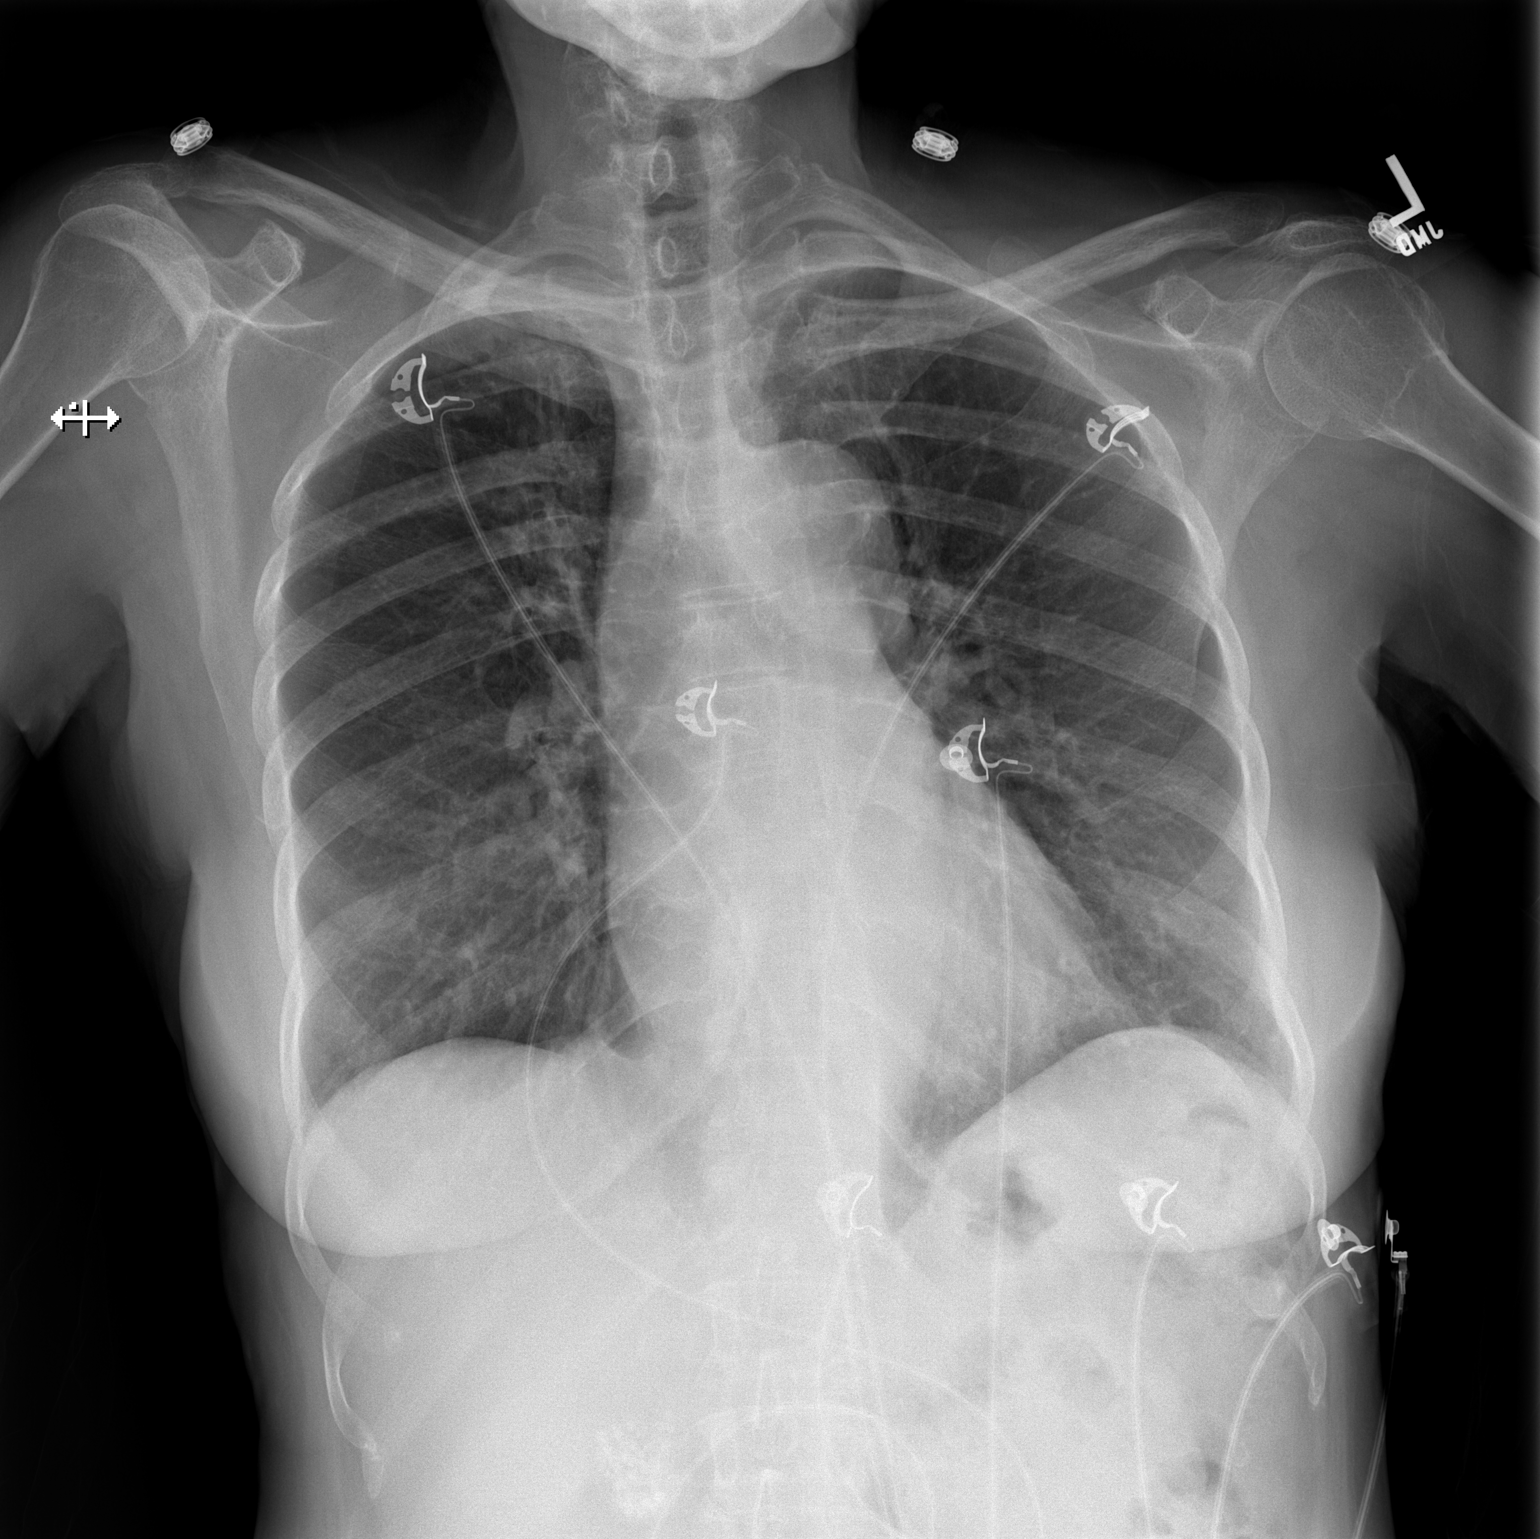

[w chest lat]
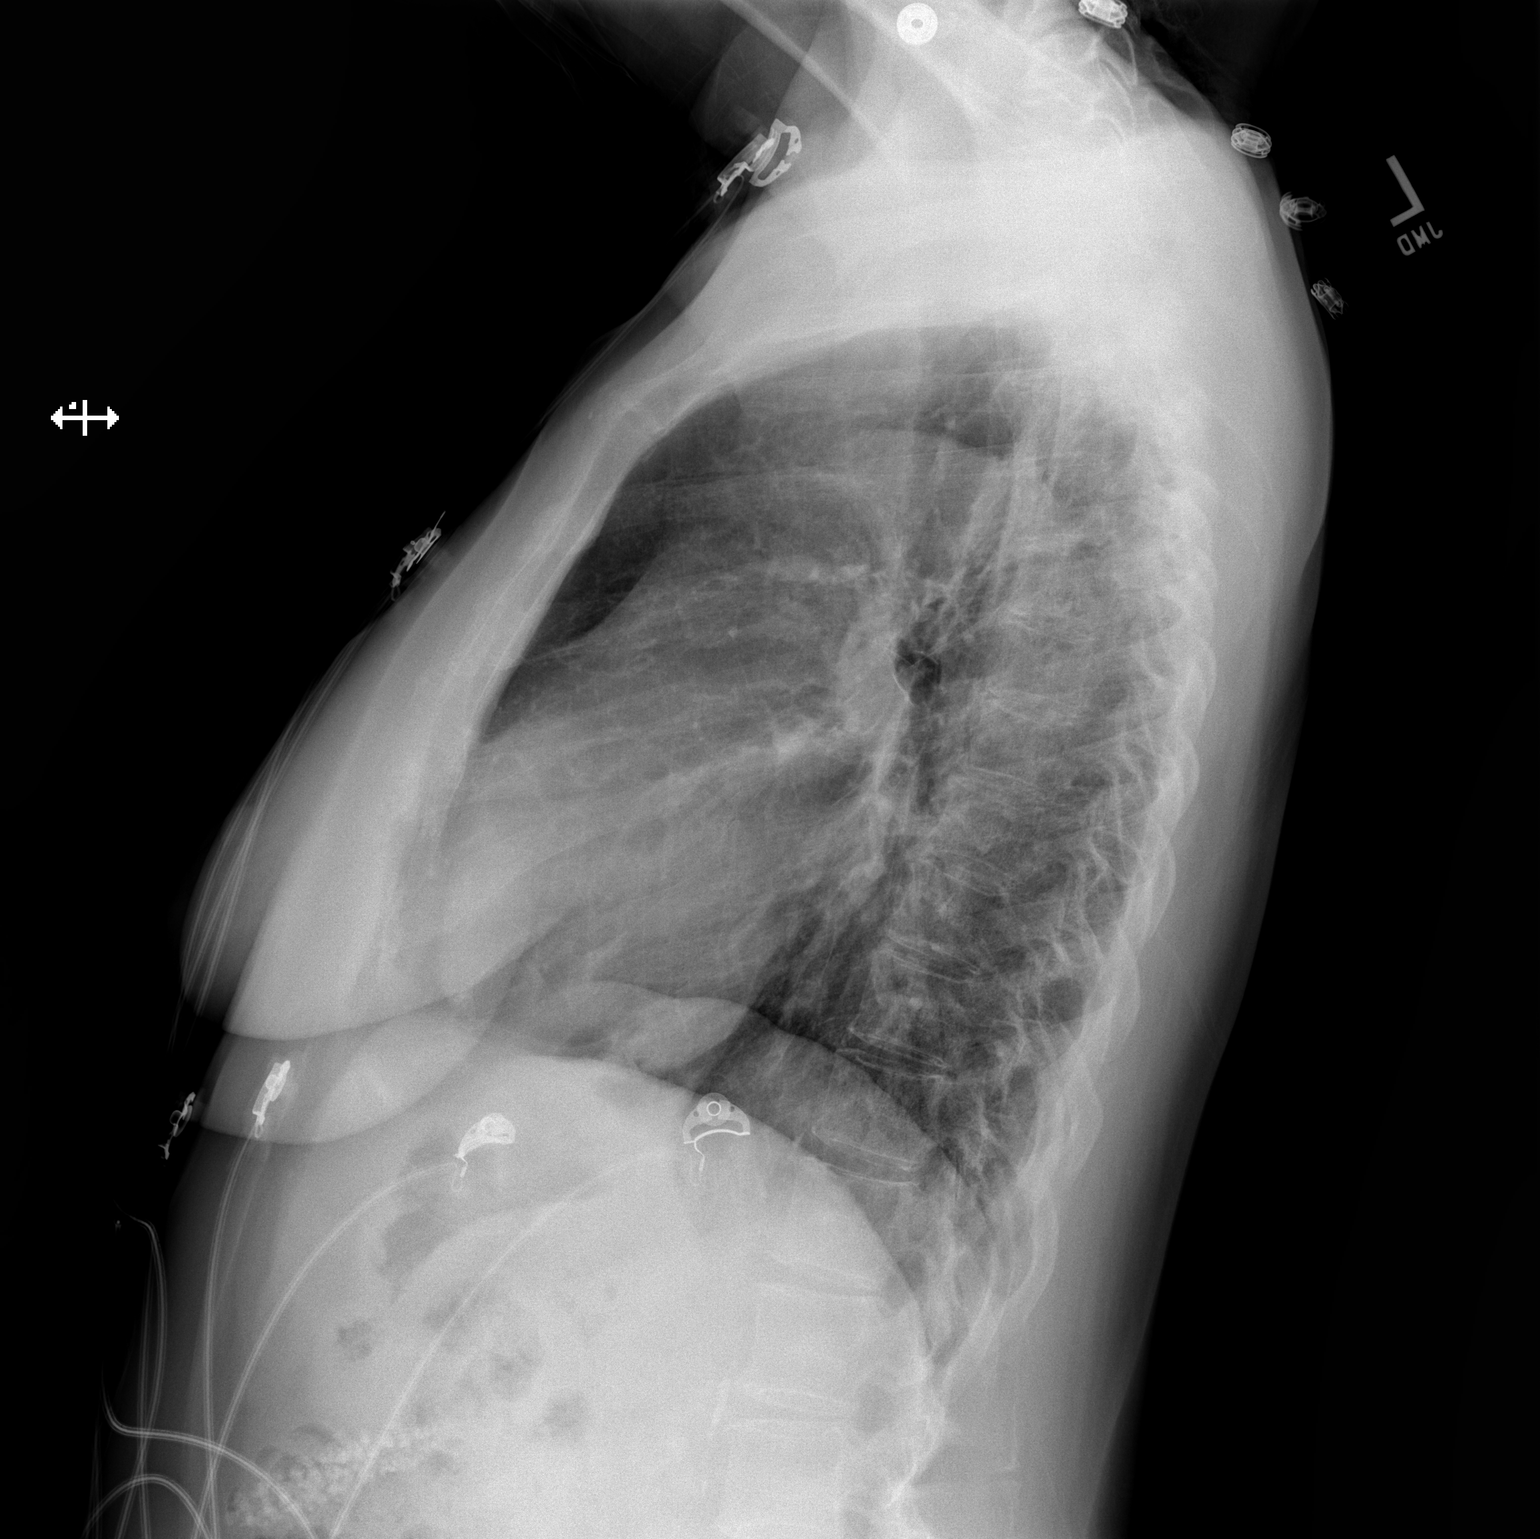

[2 of 2 positions shown; findings below may reference images not displayed]

FINDINGS: Heart size is normal. The aorta shows atherosclerotic calcification
and tortuosity. The pulmonary vascularity is normal. No edema or
effusions. Right upper lobe paratracheal patchy density could be
scarring or pneumonia. I think this finding was probably present in
0939, favoring scarring. No significant bony finding.
IMPRESSION: Chronic aortic atherosclerosis and tortuosity of the aorta.

Patchy density in the medial right upper lobe which I think was
present in 0939, though not in 7669. This is most consistent with
chronic scarring. Cannot rule out some patchy pneumonia in this
region.

## 2019-12-10 DIAGNOSIS — M85851 Other specified disorders of bone density and structure, right thigh: Secondary | ICD-10-CM | POA: Diagnosis not present

## 2019-12-10 DIAGNOSIS — M85852 Other specified disorders of bone density and structure, left thigh: Secondary | ICD-10-CM | POA: Diagnosis not present

## 2019-12-10 DIAGNOSIS — Z780 Asymptomatic menopausal state: Secondary | ICD-10-CM | POA: Diagnosis not present

## 2019-12-18 DIAGNOSIS — Z1231 Encounter for screening mammogram for malignant neoplasm of breast: Secondary | ICD-10-CM | POA: Diagnosis not present

## 2020-03-22 DIAGNOSIS — H6042 Cholesteatoma of left external ear: Secondary | ICD-10-CM | POA: Diagnosis not present

## 2020-03-22 DIAGNOSIS — R6889 Other general symptoms and signs: Secondary | ICD-10-CM | POA: Diagnosis not present

## 2020-06-04 DIAGNOSIS — R6889 Other general symptoms and signs: Secondary | ICD-10-CM | POA: Diagnosis not present

## 2020-10-21 DIAGNOSIS — R6889 Other general symptoms and signs: Secondary | ICD-10-CM | POA: Diagnosis not present

## 2020-10-21 DIAGNOSIS — H6042 Cholesteatoma of left external ear: Secondary | ICD-10-CM | POA: Diagnosis not present

## 2020-12-23 DIAGNOSIS — Z720 Tobacco use: Secondary | ICD-10-CM | POA: Diagnosis not present

## 2020-12-23 DIAGNOSIS — R6889 Other general symptoms and signs: Secondary | ICD-10-CM | POA: Diagnosis not present

## 2020-12-23 DIAGNOSIS — Z1389 Encounter for screening for other disorder: Secondary | ICD-10-CM | POA: Diagnosis not present

## 2020-12-23 DIAGNOSIS — I10 Essential (primary) hypertension: Secondary | ICD-10-CM | POA: Diagnosis not present

## 2020-12-23 DIAGNOSIS — Z0000 Encounter for general adult medical examination without abnormal findings: Secondary | ICD-10-CM | POA: Diagnosis not present

## 2020-12-23 DIAGNOSIS — M8588 Other specified disorders of bone density and structure, other site: Secondary | ICD-10-CM | POA: Diagnosis not present

## 2020-12-23 DIAGNOSIS — C029 Malignant neoplasm of tongue, unspecified: Secondary | ICD-10-CM | POA: Diagnosis not present

## 2020-12-23 DIAGNOSIS — R748 Abnormal levels of other serum enzymes: Secondary | ICD-10-CM | POA: Diagnosis not present

## 2020-12-23 DIAGNOSIS — Z23 Encounter for immunization: Secondary | ICD-10-CM | POA: Diagnosis not present

## 2020-12-23 DIAGNOSIS — E46 Unspecified protein-calorie malnutrition: Secondary | ICD-10-CM | POA: Diagnosis not present

## 2020-12-27 DIAGNOSIS — Z1231 Encounter for screening mammogram for malignant neoplasm of breast: Secondary | ICD-10-CM | POA: Diagnosis not present

## 2020-12-27 DIAGNOSIS — R6889 Other general symptoms and signs: Secondary | ICD-10-CM | POA: Diagnosis not present

## 2021-01-24 DIAGNOSIS — H6042 Cholesteatoma of left external ear: Secondary | ICD-10-CM | POA: Diagnosis not present

## 2021-01-24 DIAGNOSIS — R6889 Other general symptoms and signs: Secondary | ICD-10-CM | POA: Diagnosis not present

## 2021-05-17 DIAGNOSIS — R6889 Other general symptoms and signs: Secondary | ICD-10-CM | POA: Diagnosis not present

## 2021-05-17 DIAGNOSIS — H7292 Unspecified perforation of tympanic membrane, left ear: Secondary | ICD-10-CM | POA: Diagnosis not present

## 2021-05-17 DIAGNOSIS — H6042 Cholesteatoma of left external ear: Secondary | ICD-10-CM | POA: Diagnosis not present

## 2021-05-17 DIAGNOSIS — F17210 Nicotine dependence, cigarettes, uncomplicated: Secondary | ICD-10-CM | POA: Diagnosis not present

## 2021-05-17 DIAGNOSIS — H6121 Impacted cerumen, right ear: Secondary | ICD-10-CM | POA: Diagnosis not present

## 2021-09-06 DIAGNOSIS — H938X2 Other specified disorders of left ear: Secondary | ICD-10-CM | POA: Diagnosis not present

## 2021-10-04 DIAGNOSIS — Z1211 Encounter for screening for malignant neoplasm of colon: Secondary | ICD-10-CM | POA: Diagnosis not present

## 2021-10-04 DIAGNOSIS — K623 Rectal prolapse: Secondary | ICD-10-CM | POA: Diagnosis not present

## 2021-10-04 DIAGNOSIS — K5730 Diverticulosis of large intestine without perforation or abscess without bleeding: Secondary | ICD-10-CM | POA: Diagnosis not present

## 2021-10-04 DIAGNOSIS — K648 Other hemorrhoids: Secondary | ICD-10-CM | POA: Diagnosis not present

## 2021-10-04 DIAGNOSIS — D175 Benign lipomatous neoplasm of intra-abdominal organs: Secondary | ICD-10-CM | POA: Diagnosis not present

## 2021-11-28 DIAGNOSIS — R6889 Other general symptoms and signs: Secondary | ICD-10-CM | POA: Diagnosis not present

## 2021-11-28 DIAGNOSIS — H6042 Cholesteatoma of left external ear: Secondary | ICD-10-CM | POA: Diagnosis not present

## 2022-01-02 DIAGNOSIS — M85851 Other specified disorders of bone density and structure, right thigh: Secondary | ICD-10-CM | POA: Diagnosis not present

## 2022-01-02 DIAGNOSIS — R6889 Other general symptoms and signs: Secondary | ICD-10-CM | POA: Diagnosis not present

## 2022-01-02 DIAGNOSIS — Z1231 Encounter for screening mammogram for malignant neoplasm of breast: Secondary | ICD-10-CM | POA: Diagnosis not present

## 2022-01-02 DIAGNOSIS — Z780 Asymptomatic menopausal state: Secondary | ICD-10-CM | POA: Diagnosis not present

## 2022-01-17 DIAGNOSIS — D696 Thrombocytopenia, unspecified: Secondary | ICD-10-CM | POA: Diagnosis not present

## 2022-01-17 DIAGNOSIS — Z720 Tobacco use: Secondary | ICD-10-CM | POA: Diagnosis not present

## 2022-01-17 DIAGNOSIS — C029 Malignant neoplasm of tongue, unspecified: Secondary | ICD-10-CM | POA: Diagnosis not present

## 2022-01-17 DIAGNOSIS — M8588 Other specified disorders of bone density and structure, other site: Secondary | ICD-10-CM | POA: Diagnosis not present

## 2022-01-17 DIAGNOSIS — D175 Benign lipomatous neoplasm of intra-abdominal organs: Secondary | ICD-10-CM | POA: Diagnosis not present

## 2022-01-17 DIAGNOSIS — I10 Essential (primary) hypertension: Secondary | ICD-10-CM | POA: Diagnosis not present

## 2022-01-17 DIAGNOSIS — G250 Essential tremor: Secondary | ICD-10-CM | POA: Diagnosis not present

## 2022-01-17 DIAGNOSIS — Z0000 Encounter for general adult medical examination without abnormal findings: Secondary | ICD-10-CM | POA: Diagnosis not present

## 2022-01-17 DIAGNOSIS — R7301 Impaired fasting glucose: Secondary | ICD-10-CM | POA: Diagnosis not present

## 2022-01-17 DIAGNOSIS — Z23 Encounter for immunization: Secondary | ICD-10-CM | POA: Diagnosis not present

## 2022-01-17 DIAGNOSIS — K5730 Diverticulosis of large intestine without perforation or abscess without bleeding: Secondary | ICD-10-CM | POA: Diagnosis not present

## 2022-01-24 ENCOUNTER — Other Ambulatory Visit: Payer: Self-pay | Admitting: Internal Medicine

## 2022-01-24 DIAGNOSIS — Z122 Encounter for screening for malignant neoplasm of respiratory organs: Secondary | ICD-10-CM

## 2022-02-27 ENCOUNTER — Ambulatory Visit
Admission: RE | Admit: 2022-02-27 | Discharge: 2022-02-27 | Disposition: A | Discharge status: Home / Self Care | Payer: Medicare HMO | Source: Ambulatory Visit | Attending: Internal Medicine | Admitting: Internal Medicine

## 2022-02-27 DIAGNOSIS — F17210 Nicotine dependence, cigarettes, uncomplicated: Secondary | ICD-10-CM | POA: Diagnosis not present

## 2022-02-27 DIAGNOSIS — Z122 Encounter for screening for malignant neoplasm of respiratory organs: Secondary | ICD-10-CM

## 2022-03-29 DIAGNOSIS — H6042 Cholesteatoma of left external ear: Secondary | ICD-10-CM | POA: Diagnosis not present

## 2022-03-29 DIAGNOSIS — R6889 Other general symptoms and signs: Secondary | ICD-10-CM | POA: Diagnosis not present

## 2022-06-29 DIAGNOSIS — H6042 Cholesteatoma of left external ear: Secondary | ICD-10-CM | POA: Diagnosis not present

## 2022-06-29 DIAGNOSIS — H7292 Unspecified perforation of tympanic membrane, left ear: Secondary | ICD-10-CM | POA: Diagnosis not present

## 2022-09-15 ENCOUNTER — Encounter (HOSPITAL_COMMUNITY): Payer: Self-pay

## 2022-09-15 VITALS — BP 155/86 | HR 79 | Temp 98.1°F | Resp 18

## 2022-09-15 DIAGNOSIS — H66002 Acute suppurative otitis media without spontaneous rupture of ear drum, left ear: Secondary | ICD-10-CM

## 2022-09-15 DIAGNOSIS — J0140 Acute pansinusitis, unspecified: Secondary | ICD-10-CM | POA: Diagnosis not present

## 2022-09-15 HISTORY — DX: Essential (primary) hypertension: I10

## 2022-09-15 MED ORDER — CEFDINIR 300 MG PO CAPS: 300.0000 mg | ORAL_CAPSULE | Freq: Two times a day (BID) | ORAL | 0 refills | Status: AC

## 2022-09-18 ENCOUNTER — Encounter (HOSPITAL_COMMUNITY): Payer: Self-pay

## 2022-10-02 DIAGNOSIS — H6042 Cholesteatoma of left external ear: Secondary | ICD-10-CM | POA: Diagnosis not present

## 2022-12-05 DIAGNOSIS — H6042 Cholesteatoma of left external ear: Secondary | ICD-10-CM | POA: Diagnosis not present

## 2023-01-30 DIAGNOSIS — J439 Emphysema, unspecified: Secondary | ICD-10-CM | POA: Diagnosis not present

## 2023-01-30 DIAGNOSIS — D696 Thrombocytopenia, unspecified: Secondary | ICD-10-CM | POA: Diagnosis not present

## 2023-01-30 DIAGNOSIS — R5383 Other fatigue: Secondary | ICD-10-CM | POA: Diagnosis not present

## 2023-01-30 DIAGNOSIS — I10 Essential (primary) hypertension: Secondary | ICD-10-CM | POA: Diagnosis not present

## 2023-01-30 DIAGNOSIS — Z0000 Encounter for general adult medical examination without abnormal findings: Secondary | ICD-10-CM | POA: Diagnosis not present

## 2023-01-30 DIAGNOSIS — Z23 Encounter for immunization: Secondary | ICD-10-CM | POA: Diagnosis not present

## 2023-01-30 DIAGNOSIS — R7303 Prediabetes: Secondary | ICD-10-CM | POA: Diagnosis not present

## 2023-01-30 DIAGNOSIS — I700 Atherosclerosis of aorta: Secondary | ICD-10-CM | POA: Diagnosis not present

## 2023-01-30 DIAGNOSIS — D175 Benign lipomatous neoplasm of intra-abdominal organs: Secondary | ICD-10-CM | POA: Diagnosis not present

## 2023-02-07 DIAGNOSIS — N6342 Unspecified lump in left breast, subareolar: Secondary | ICD-10-CM | POA: Diagnosis not present

## 2023-02-07 DIAGNOSIS — R6889 Other general symptoms and signs: Secondary | ICD-10-CM | POA: Diagnosis not present

## 2023-02-22 DIAGNOSIS — H6042 Cholesteatoma of left external ear: Secondary | ICD-10-CM | POA: Diagnosis not present

## 2023-02-22 DIAGNOSIS — R6889 Other general symptoms and signs: Secondary | ICD-10-CM | POA: Diagnosis not present

## 2023-04-24 DIAGNOSIS — H6121 Impacted cerumen, right ear: Secondary | ICD-10-CM | POA: Diagnosis not present

## 2023-07-17 DIAGNOSIS — H6121 Impacted cerumen, right ear: Secondary | ICD-10-CM | POA: Diagnosis not present

## 2023-07-17 DIAGNOSIS — H6042 Cholesteatoma of left external ear: Secondary | ICD-10-CM | POA: Diagnosis not present

## 2023-07-24 ENCOUNTER — Other Ambulatory Visit: Payer: Self-pay | Admitting: Internal Medicine

## 2023-07-24 ENCOUNTER — Encounter: Payer: Self-pay | Admitting: Internal Medicine

## 2023-07-24 DIAGNOSIS — I700 Atherosclerosis of aorta: Secondary | ICD-10-CM | POA: Diagnosis not present

## 2023-07-24 DIAGNOSIS — R928 Other abnormal and inconclusive findings on diagnostic imaging of breast: Secondary | ICD-10-CM | POA: Diagnosis not present

## 2023-07-24 DIAGNOSIS — R7303 Prediabetes: Secondary | ICD-10-CM | POA: Diagnosis not present

## 2023-07-24 DIAGNOSIS — I10 Essential (primary) hypertension: Secondary | ICD-10-CM | POA: Diagnosis not present

## 2023-07-24 DIAGNOSIS — Z122 Encounter for screening for malignant neoplasm of respiratory organs: Secondary | ICD-10-CM

## 2023-07-24 DIAGNOSIS — Z720 Tobacco use: Secondary | ICD-10-CM | POA: Diagnosis not present

## 2023-07-24 DIAGNOSIS — D175 Benign lipomatous neoplasm of intra-abdominal organs: Secondary | ICD-10-CM | POA: Diagnosis not present

## 2023-07-24 DIAGNOSIS — J439 Emphysema, unspecified: Secondary | ICD-10-CM | POA: Diagnosis not present

## 2023-07-24 DIAGNOSIS — G250 Essential tremor: Secondary | ICD-10-CM | POA: Diagnosis not present

## 2023-07-24 DIAGNOSIS — R630 Anorexia: Secondary | ICD-10-CM | POA: Diagnosis not present

## 2023-07-26 ENCOUNTER — Other Ambulatory Visit: Payer: Self-pay | Admitting: Internal Medicine

## 2023-07-26 DIAGNOSIS — R630 Anorexia: Secondary | ICD-10-CM

## 2023-07-26 DIAGNOSIS — R634 Abnormal weight loss: Secondary | ICD-10-CM

## 2023-08-07 ENCOUNTER — Encounter: Payer: Self-pay | Admitting: Family Medicine

## 2023-08-14 ENCOUNTER — Encounter: Payer: Self-pay | Admitting: Internal Medicine

## 2023-08-21 ENCOUNTER — Ambulatory Visit
Admission: RE | Admit: 2023-08-21 | Discharge: 2023-08-21 | Disposition: A | Discharge status: Home / Self Care | Source: Ambulatory Visit | Attending: Internal Medicine | Admitting: Internal Medicine

## 2023-08-21 DIAGNOSIS — J439 Emphysema, unspecified: Secondary | ICD-10-CM | POA: Diagnosis not present

## 2023-08-21 DIAGNOSIS — R630 Anorexia: Secondary | ICD-10-CM | POA: Diagnosis not present

## 2023-08-21 DIAGNOSIS — F17210 Nicotine dependence, cigarettes, uncomplicated: Secondary | ICD-10-CM | POA: Diagnosis not present

## 2023-08-21 DIAGNOSIS — K7460 Unspecified cirrhosis of liver: Secondary | ICD-10-CM | POA: Diagnosis not present

## 2023-08-21 DIAGNOSIS — I700 Atherosclerosis of aorta: Secondary | ICD-10-CM | POA: Diagnosis not present

## 2023-08-21 DIAGNOSIS — R634 Abnormal weight loss: Secondary | ICD-10-CM | POA: Diagnosis not present

## 2023-08-21 DIAGNOSIS — K8020 Calculus of gallbladder without cholecystitis without obstruction: Secondary | ICD-10-CM | POA: Diagnosis not present

## 2023-08-21 DIAGNOSIS — Z122 Encounter for screening for malignant neoplasm of respiratory organs: Secondary | ICD-10-CM | POA: Diagnosis not present

## 2023-08-21 MED ORDER — IOPAMIDOL (ISOVUE-300) INJECTION 61%
100.0000 mL | Freq: Once | INTRAVENOUS | Status: AC | PRN
  Administered 2023-08-21: 100 mL via INTRAVENOUS

## 2023-10-16 DIAGNOSIS — H6042 Cholesteatoma of left external ear: Secondary | ICD-10-CM | POA: Diagnosis not present

## 2023-10-16 DIAGNOSIS — H6123 Impacted cerumen, bilateral: Secondary | ICD-10-CM | POA: Diagnosis not present

## 2024-01-03 ENCOUNTER — Encounter (HOSPITAL_COMMUNITY): Payer: Self-pay

## 2024-01-03 ENCOUNTER — Ambulatory Visit (HOSPITAL_COMMUNITY)
Admission: EM | Admit: 2024-01-03 | Discharge: 2024-01-03 | Disposition: A | Discharge status: Home / Self Care | Attending: Internal Medicine | Admitting: Internal Medicine

## 2024-01-03 DIAGNOSIS — H6122 Impacted cerumen, left ear: Secondary | ICD-10-CM

## 2024-01-03 DIAGNOSIS — H60392 Other infective otitis externa, left ear: Secondary | ICD-10-CM | POA: Diagnosis not present

## 2024-01-03 MED ORDER — OFLOXACIN 0.3 % OT SOLN: 5.0000 [drp] | Freq: Two times a day (BID) | OTIC | 0 refills | Status: AC

## 2024-01-03 NOTE — Discharge Instructions (Signed)
 You have an ear infection of the ear canal known as otitis externa. Use ear drops as prescribed for 7 days. Do not place anything smaller than elbow deep into ear canal- this includes Q-tips. Place a cotton ball into the external ear canal while in the shower to avoid getting water into the ears.  You may place a small amount of rubbing alcohol onto the end of a Q-tip and place this into the outer ear canal to dry up any remaining water that may have gotten into the ear while showering or submerging head underwater to prevent this type of infection in the future.

## 2024-01-03 NOTE — ED Triage Notes (Addendum)
 Pt states left ear pain and drainage for the past 2 days.  States she has not been taking anything for it at home.

## 2024-01-03 NOTE — ED Provider Notes (Signed)
 [facility]    CSN: [id number] Arrival date & time: 01/03/24  0955      History   Chief Complaint Chief Complaint  Patient presents with   Otalgia    HPI Elizabeth Lester is a 78 y.o. female.   Elizabeth Lester is a 78 y.o. female presenting for chief complaint of Otalgia of the left ear that started 2 days ago. Reports crusty yellow/white drainage from the left ear associated with pain. Denies decreased hearing to the left ear, dizziness, fever/chills, neck pain, viral URI symptoms, and recent antibiotics. Denies recent swimming or water to the left ear canal. She has not attempted use of any OTC medications for symptoms at home.    Otalgia   Past Medical History:  Diagnosis Date   Bell's palsy    Cancer of tongue (HCC)    Hypertension     Patient Active Problem List   Diagnosis Date Noted   Cerumen impaction 09/10/2014   Allergic rhinitis 09/10/2014   Otitis externa 05/27/2014   Sinusitis, acute 05/27/2014   Dizziness 03/26/2013   Medication refill 03/26/2013   Malignant neoplasm of head, face and neck (HCC) 03/20/2012   Essential (primary) hypertension 01/09/2011    Past Surgical History:  Procedure Laterality Date   ABDOMINAL HYSTERECTOMY     DENTAL SURGERY     JEJUNOSTOMY FEEDING TUBE      OB History   No obstetric history on file.      Home Medications    Prior to Admission medications   Medication Sig Start Date End Date Taking? Authorizing Provider  ofloxacin  (FLOXIN ) 0.3 % OTIC solution Place 5 drops into the left ear 2 (two) times daily for 7 days. 01/03/24 01/10/24 Yes StanhopeDorna HERO, FNP  fluticasone  (FLONASE ) 50 MCG/ACT nasal spray Place 2 sprays into both nostrils daily. Patient not taking: Reported on 07/20/2017 05/01/16   Antonio Cyndee Jamee JONELLE, DO  lisinopril  (PRINIVIL ,ZESTRIL ) 20 MG tablet Take 1 tablet (20 mg total) by mouth daily. 02/25/16   Antonio Cyndee Jamee JONELLE, DO  lisinopril  (ZESTRIL ) 10 MG tablet Take 10 mg by mouth  daily. 06/24/22   [provider]  loratadine  (CLARITIN ) 10 MG tablet Take 1 tablet (10 mg total) by mouth daily. Patient not taking: Reported on 07/20/2017 02/25/16   Antonio Cyndee, Jamee R, DO  OVER THE COUNTER MEDICATION Take 15 mLs by mouth 3 (three) times daily. LIQUID VITAMIN C AND CALCIUM FROM HEALTH FOOD STORE    [provider]  primidone (MYSOLINE) 50 MG tablet Take 25 mg by mouth at bedtime. 07/13/22   [provider]  traMADol  (ULTRAM ) 50 MG tablet Take 1 tablet (50 mg total) by mouth every 6 (six) hours as needed. Patient not taking: Reported on 07/20/2017 09/07/16   Nivia Colon, PA-C    Family History Family History  Problem Relation Age of Onset   Hypertension Mother    Heart disease Mother        pacemaker   Cancer Father 47       prostate   Cancer Brother     Social History Social History   Tobacco Use   Smoking status: Every Day    Current packs/day: 0.50    Types: Cigarettes   Smokeless tobacco: Never  Vaping Use   Vaping status: Never Used  Substance Use Topics   Alcohol use: Yes    Comment: occ   Drug use: Never     Allergies   Patient has no known allergies.  Review of Systems Review of Systems  HENT:  Positive for ear pain.      Physical Exam Triage Vital Signs ED Triage Vitals  Encounter Vitals Group     BP 01/03/24 1027 (!) 167/82     Girls Systolic BP Percentile --      Girls Diastolic BP Percentile --      Boys Systolic BP Percentile --      Boys Diastolic BP Percentile --      Pulse Rate 01/03/24 1027 73     Resp 01/03/24 1027 16     Temp 01/03/24 1027 97.9 F (36.6 C)     Temp Source 01/03/24 1027 Oral     SpO2 01/03/24 1027 98 %     Weight --      Height --      Head Circumference --      Peak Flow --      Pain Score 01/03/24 1028 8     Pain Loc --      Pain Education --      Exclude from Growth Chart --    No data found.  Updated Vital Signs BP (!) 167/82 (BP Location: Left Arm)   Pulse 73    Temp 97.9 F (36.6 C) (Oral)   Resp 16   SpO2 98%   Visual Acuity Right Eye Distance:   Left Eye Distance:   Bilateral Distance:    Right Eye Near:   Left Eye Near:    Bilateral Near:     Physical Exam Vitals and nursing note reviewed.  Constitutional:      Appearance: She is not ill-appearing or toxic-appearing.  HENT:     Head: Normocephalic and atraumatic.     Right Ear: Hearing, tympanic membrane, ear canal and external ear normal.     Left Ear: Hearing, tympanic membrane, ear canal and external ear normal. No tenderness. Tympanic membrane is not perforated.     Ears:     Comments: Left TM intact. Crusty, thick, and gooey yellow/white drainage to the floor of the left ear canal, non-obstructive.     Nose: Nose normal.     Mouth/Throat:     Lips: Pink.     Mouth: Mucous membranes are moist. No injury or oral lesions.     Dentition: Normal dentition.     Tongue: No lesions.     Pharynx: Oropharynx is clear. Uvula midline. No pharyngeal swelling, oropharyngeal exudate, posterior oropharyngeal erythema, uvula swelling or postnasal drip.     Tonsils: No tonsillar exudate.  Eyes:     General: Lids are normal. Vision grossly intact. Gaze aligned appropriately.     Extraocular Movements: Extraocular movements intact.     Conjunctiva/sclera: Conjunctivae normal.  Neck:     Trachea: Trachea and phonation normal.  Pulmonary:     Effort: Pulmonary effort is normal.  Musculoskeletal:     Cervical back: Neck supple.  Lymphadenopathy:     Cervical: No cervical adenopathy.  Skin:    General: Skin is warm and dry.     Capillary Refill: Capillary refill takes less than 2 seconds.     Findings: No rash.  Neurological:     General: No focal deficit present.     Mental Status: She is alert and oriented to person, place, and time. Mental status is at baseline.     Cranial Nerves: No dysarthria or facial asymmetry.  Psychiatric:        Mood and Affect: Mood normal.  Speech:  Speech normal.        Behavior: Behavior normal.        Thought Content: Thought content normal.        Judgment: Judgment normal.      UC Treatments / Results  Labs (all labs ordered are listed, but only abnormal results are displayed) Labs Reviewed - No data to display  EKG   Radiology No results found.  Procedures Procedures (including critical care time)  Medications Ordered in UC Medications - No data to display  Initial Impression / Assessment and Plan / UC Course  I have reviewed the triage vital signs and the nursing notes.  Pertinent labs & imaging results that were available during my care of the patient were reviewed by me and considered in my medical decision making (see chart for details).   1. Infective otitis externa of left ear, impacted cerumen of left ear Presentation consistent with otitis externa. Will manage this with Ofloxacin  otic drops as prescribed for 7 days. Encouraged to avoid getting water into affected ear(s) for at least 7-10 days. Cotton ball to the external ear during showers to avoid water into the ear canal. Over the counter medications as needed for pain.  Ear lavage performed by nursing staff improved drainage to the left ear canal.   Counseled patient on potential for adverse effects with medications prescribed/recommended today, strict ER and return-to-clinic precautions discussed, patient verbalized understanding.    Final Clinical Impressions(s) / UC Diagnoses   Final diagnoses:  Infective otitis externa of left ear  Impacted cerumen of left ear     Discharge Instructions      You have an ear infection of the ear canal known as otitis externa. Use ear drops as prescribed for 7 days. Do not place anything smaller than elbow deep into ear canal- this includes Q-tips. Place a cotton ball into the external ear canal while in the shower to avoid getting water into the ears.  You may place a small amount of rubbing alcohol onto  the end of a Q-tip and place this into the outer ear canal to dry up any remaining water that may have gotten into the ear while showering or submerging head underwater to prevent this type of infection in the future.       ED Prescriptions     Medication Sig Dispense Auth. Provider   ofloxacin  (FLOXIN ) 0.3 % OTIC solution Place 5 drops into the left ear 2 (two) times daily for 7 days. 5 mL Enedelia Dorna HERO, FNP      PDMP not reviewed this encounter.   Enedelia Dorna HERO, [place] 01/03/24 1123

## 2024-01-31 DIAGNOSIS — J439 Emphysema, unspecified: Secondary | ICD-10-CM | POA: Diagnosis not present

## 2024-01-31 DIAGNOSIS — Z0000 Encounter for general adult medical examination without abnormal findings: Secondary | ICD-10-CM | POA: Diagnosis not present

## 2024-01-31 DIAGNOSIS — C029 Malignant neoplasm of tongue, unspecified: Secondary | ICD-10-CM | POA: Diagnosis not present

## 2024-01-31 DIAGNOSIS — K8020 Calculus of gallbladder without cholecystitis without obstruction: Secondary | ICD-10-CM | POA: Diagnosis not present

## 2024-01-31 DIAGNOSIS — R7303 Prediabetes: Secondary | ICD-10-CM | POA: Diagnosis not present

## 2024-01-31 DIAGNOSIS — D696 Thrombocytopenia, unspecified: Secondary | ICD-10-CM | POA: Diagnosis not present

## 2024-01-31 DIAGNOSIS — D175 Benign lipomatous neoplasm of intra-abdominal organs: Secondary | ICD-10-CM | POA: Diagnosis not present

## 2024-01-31 DIAGNOSIS — K7469 Other cirrhosis of liver: Secondary | ICD-10-CM | POA: Diagnosis not present

## 2024-01-31 DIAGNOSIS — I10 Essential (primary) hypertension: Secondary | ICD-10-CM | POA: Diagnosis not present

## 2024-01-31 DIAGNOSIS — M8588 Other specified disorders of bone density and structure, other site: Secondary | ICD-10-CM | POA: Diagnosis not present

## 2024-01-31 DIAGNOSIS — I700 Atherosclerosis of aorta: Secondary | ICD-10-CM | POA: Diagnosis not present

## 2024-02-04 DIAGNOSIS — H6123 Impacted cerumen, bilateral: Secondary | ICD-10-CM | POA: Diagnosis not present

## 2024-02-08 DIAGNOSIS — Z1231 Encounter for screening mammogram for malignant neoplasm of breast: Secondary | ICD-10-CM | POA: Diagnosis not present
